# Patient Record
Sex: Female | Born: 1970 | Race: White | Hispanic: No | Marital: Married | Smoking: Never smoker
Health system: Southern US, Community
[De-identification: ages and names within clinical notes are randomized; demographics above are authoritative.]

## PROBLEM LIST (undated history)

## (undated) DIAGNOSIS — J45909 Unspecified asthma, uncomplicated: Secondary | ICD-10-CM

## (undated) DIAGNOSIS — T7840XA Allergy, unspecified, initial encounter: Secondary | ICD-10-CM

## (undated) DIAGNOSIS — E079 Disorder of thyroid, unspecified: Secondary | ICD-10-CM

## (undated) DIAGNOSIS — F419 Anxiety disorder, unspecified: Secondary | ICD-10-CM

## (undated) DIAGNOSIS — K589 Irritable bowel syndrome without diarrhea: Secondary | ICD-10-CM

## (undated) DIAGNOSIS — R011 Cardiac murmur, unspecified: Secondary | ICD-10-CM

## (undated) HISTORY — DX: Anxiety disorder, unspecified: F41.9

## (undated) HISTORY — DX: Disorder of thyroid, unspecified: E07.9

## (undated) HISTORY — DX: Allergy, unspecified, initial encounter: T78.40XA

## (undated) HISTORY — PX: WISDOM TOOTH EXTRACTION: SHX21

## (undated) HISTORY — DX: Cardiac murmur, unspecified: R01.1

## (undated) HISTORY — DX: Irritable bowel syndrome, unspecified: K58.9

## (undated) HISTORY — DX: Unspecified asthma, uncomplicated: J45.909

## (undated) HISTORY — PX: OTHER SURGICAL HISTORY: SHX169

---

## 1998-05-18 ENCOUNTER — Ambulatory Visit (HOSPITAL_COMMUNITY): Admission: RE | Admit: 1998-05-18 | Discharge: 1998-05-18 | Payer: Self-pay

## 1998-06-28 ENCOUNTER — Ambulatory Visit (HOSPITAL_COMMUNITY): Admission: RE | Admit: 1998-06-28 | Discharge: 1998-06-28 | Payer: Self-pay

## 1998-08-09 ENCOUNTER — Ambulatory Visit (HOSPITAL_COMMUNITY): Admission: RE | Admit: 1998-08-09 | Discharge: 1998-08-09 | Payer: Self-pay

## 1998-09-28 ENCOUNTER — Ambulatory Visit (HOSPITAL_COMMUNITY): Admission: RE | Admit: 1998-09-28 | Discharge: 1998-09-28 | Payer: Self-pay

## 2009-08-28 DIAGNOSIS — N281 Cyst of kidney, acquired: Secondary | ICD-10-CM | POA: Insufficient documentation

## 2013-01-03 DIAGNOSIS — K589 Irritable bowel syndrome without diarrhea: Secondary | ICD-10-CM | POA: Insufficient documentation

## 2013-01-03 DIAGNOSIS — J330 Polyp of nasal cavity: Secondary | ICD-10-CM | POA: Insufficient documentation

## 2013-01-03 DIAGNOSIS — R011 Cardiac murmur, unspecified: Secondary | ICD-10-CM | POA: Insufficient documentation

## 2013-01-03 DIAGNOSIS — I059 Rheumatic mitral valve disease, unspecified: Secondary | ICD-10-CM | POA: Insufficient documentation

## 2014-01-23 DIAGNOSIS — Z0000 Encounter for general adult medical examination without abnormal findings: Secondary | ICD-10-CM | POA: Insufficient documentation

## 2015-02-04 DIAGNOSIS — E559 Vitamin D deficiency, unspecified: Secondary | ICD-10-CM | POA: Insufficient documentation

## 2015-06-11 DIAGNOSIS — K219 Gastro-esophageal reflux disease without esophagitis: Secondary | ICD-10-CM | POA: Insufficient documentation

## 2015-06-11 DIAGNOSIS — J309 Allergic rhinitis, unspecified: Secondary | ICD-10-CM | POA: Insufficient documentation

## 2015-06-15 ENCOUNTER — Ambulatory Visit (INDEPENDENT_AMBULATORY_CARE_PROVIDER_SITE_OTHER): Payer: Commercial Managed Care - PPO | Admitting: Pediatrics

## 2015-06-15 ENCOUNTER — Encounter: Payer: Self-pay | Admitting: Pediatrics

## 2015-06-15 VITALS — BP 122/72 | HR 62 | Temp 98.8°F | Resp 16 | Ht 65.2 in | Wt 147.0 lb

## 2015-06-15 DIAGNOSIS — J301 Allergic rhinitis due to pollen: Secondary | ICD-10-CM | POA: Diagnosis not present

## 2015-06-15 DIAGNOSIS — J4530 Mild persistent asthma, uncomplicated: Secondary | ICD-10-CM | POA: Diagnosis not present

## 2015-06-15 DIAGNOSIS — T781XXD Other adverse food reactions, not elsewhere classified, subsequent encounter: Secondary | ICD-10-CM | POA: Diagnosis not present

## 2015-06-15 DIAGNOSIS — T781XXA Other adverse food reactions, not elsewhere classified, initial encounter: Secondary | ICD-10-CM | POA: Insufficient documentation

## 2015-06-15 DIAGNOSIS — E739 Lactose intolerance, unspecified: Secondary | ICD-10-CM | POA: Diagnosis not present

## 2015-06-15 DIAGNOSIS — J4520 Mild intermittent asthma, uncomplicated: Secondary | ICD-10-CM | POA: Insufficient documentation

## 2015-06-15 MED ORDER — ALBUTEROL SULFATE HFA 108 (90 BASE) MCG/ACT IN AERS: INHALATION_SPRAY | RESPIRATORY_TRACT | Status: DC

## 2015-06-15 NOTE — Progress Notes (Signed)
FOLLOW UP NOTE  RE: Kelsey Coleman MRN: [id number] DOB: August 15, 1971 [facility] [facility] [address] [place] [address] Date of Office Visit: 06/15/2015  Assessment Chief Complaint: Follow-up and Breathing Problem   HPI The patient develops some tightness in the chest after mowing the grass. She has been on montelukast  for several months to help a chronic cough She has used an albuterol inhaler in the past  Her abdominal discomfort is much improved. She is definitely lactose intolerant. She has also been avoiding almond,, hazelnut, tree nuts, sesame and pineapple. She is extremely allergic to birch pollen, dust mite, grass pollen and slightly  allergic to ragweed.  Current medications are montelukast  10 mg once a day, loratadine 10 mg once a day if needed and fluticasone 2 sprays per nostril once a day if needed Drug Allergies:  Allergies  Allergen Reactions  . Iodinated Diagnostic Agents     Physical Exam: BP 122/72 mmHg  Pulse 62  Temp(Src) 98.8 F (37.1 C) (Oral)  Resp 16  Ht 5' 5.2" (1.656 m)  Wt 147 lb (66.679 kg)  BMI 24.31 kg/m2  Physical Exam  Constitutional: She is oriented to person, place, and time. She appears well-developed and well-nourished.  HENT:  Eyes normal. Ears normal. Nose normal. Pharynx normal.  Neck: Neck supple.  Cardiovascular:  S1 and S2 normal no murmurs  Pulmonary/Chest:  Clear to percussion and auscultation  Lymphadenopathy:    She has no cervical adenopathy.  Neurological: She is alert and oriented to person, place, and time.    Diagnostics:  Forced vital capacity 3.01 L FEV1 2.60 L. Predicted forced vitalcapacity 3.78 L predicted FEV1 3.05 L-the spirometry is in the normal range  Assessment and Plan: 1. Mild persistent asthma, uncomplicated   2. Allergic rhinitis due to pollen   3. Oral allergy syndrome, subsequent encounter   4. Lactose intolerance in adult    Meds  ordered this encounter  Medications  . albuterol (VENTOLIN HFA) 108 (90 BASE) MCG/ACT inhaler    Sig: TWO PUFFS EVERY 4-6 HOURS IF NEEDED FOR COUGH OR WHEEZE.    Dispense:  8 g    Refill:  1   Patient Instructions  Ventolin 2 puffs every 4 hours if needed for wheezing or coughing spells Loratadine 10 mg once a day if needed for runny nose Fluticasone 2 sprays per nostril once a day if needed for stuffy nose She was given information regarding lactose intolerance  She will avoid almonds, hazelnut, tree nuts, sesame, pineapple.    Return in about 6 months (around 12/14/2015).    Thank you for the opportunity to care for this patient.  Please do not hesitate to contact me with questions.  [facility] [address] [place],  [address] [phone]  J. Charyl Bigger, M.D.

## 2015-06-15 NOTE — Patient Instructions (Signed)
Ventolin 2 puffs every 4 hours if needed for wheezing or coughing spells Loratadine 10 mg once a day if needed for runny nose Fluticasone 2 sprays per nostril once a day if needed for stuffy nose She was given information regarding lactose intolerance  She will avoid almonds, hazelnut, tree nuts, sesame, pineapple.

## 2015-06-15 NOTE — Addendum Note (Signed)
Addended by: Prince Solian A on: 06/15/2015 05:47 PM   Modules accepted: Orders

## 2017-03-27 DIAGNOSIS — R319 Hematuria, unspecified: Secondary | ICD-10-CM | POA: Insufficient documentation

## 2017-05-09 ENCOUNTER — Encounter: Payer: Self-pay | Admitting: Family Medicine

## 2018-02-05 ENCOUNTER — Encounter: Payer: Self-pay | Admitting: Family Medicine

## 2018-10-04 MED FILL — ARMOUR THYROID 30 MG TABLET: 30 | 90 days supply | Qty: 90 | Fill #0 | Status: TO

## 2018-10-04 MED FILL — DICYCLOMINE 10 MG CAPSULE: 10 | 30 days supply | Qty: 150 | Fill #0

## 2018-10-10 DIAGNOSIS — Z833 Family history of diabetes mellitus: Secondary | ICD-10-CM | POA: Diagnosis not present

## 2018-10-10 DIAGNOSIS — Z0000 Encounter for general adult medical examination without abnormal findings: Secondary | ICD-10-CM | POA: Diagnosis not present

## 2018-10-10 DIAGNOSIS — Z7689 Persons encountering health services in other specified circumstances: Secondary | ICD-10-CM | POA: Diagnosis not present

## 2018-10-10 DIAGNOSIS — Z862 Personal history of diseases of the blood and blood-forming organs and certain disorders involving the immune mechanism: Secondary | ICD-10-CM | POA: Diagnosis not present

## 2018-10-10 DIAGNOSIS — Z8349 Family history of other endocrine, nutritional and metabolic diseases: Secondary | ICD-10-CM | POA: Diagnosis not present

## 2018-10-10 DIAGNOSIS — Z23 Encounter for immunization: Secondary | ICD-10-CM | POA: Diagnosis not present

## 2018-10-22 DIAGNOSIS — M25511 Pain in right shoulder: Secondary | ICD-10-CM | POA: Diagnosis not present

## 2018-10-22 DIAGNOSIS — M545 Low back pain: Secondary | ICD-10-CM | POA: Diagnosis not present

## 2018-10-22 DIAGNOSIS — M9901 Segmental and somatic dysfunction of cervical region: Secondary | ICD-10-CM | POA: Diagnosis not present

## 2018-12-13 MED FILL — ARMOUR THYROID 30 MG TABLET: 30 | 90 days supply | Qty: 90 | Fill #0

## 2018-12-26 ENCOUNTER — Ambulatory Visit: Payer: Self-pay | Admitting: Family Medicine

## 2019-01-27 ENCOUNTER — Other Ambulatory Visit: Payer: Self-pay | Admitting: Family Medicine

## 2019-01-27 DIAGNOSIS — Z1231 Encounter for screening mammogram for malignant neoplasm of breast: Secondary | ICD-10-CM

## 2019-01-29 ENCOUNTER — Ambulatory Visit: Payer: Self-pay | Admitting: Family Medicine

## 2019-01-29 ENCOUNTER — Telehealth: Payer: Self-pay

## 2019-01-29 NOTE — Telephone Encounter (Signed)
Questions for Screening COVID-19  Symptom onset: n/a  Travel or Contacts: no travel/ no contact tha tpt aware  During this illness, did/does the patient experience any of the following symptoms? Fever >100.32F []   Yes [x]   No []   Unknown Subjective fever (felt feverish) []   Yes [x]   No []   Unknown Chills []   Yes [x]   No []   Unknown Muscle aches (myalgia) []   Yes [x]   No []   Unknown Runny nose (rhinorrhea) []   Yes [x]   No []   Unknown Sore throat []   Yes [x]   No []   Unknown Cough (new onset or worsening of chronic cough) []   Yes [x]   No []   Unknown Shortness of breath (dyspnea) []   Yes [x]   No []   Unknown Nausea or vomiting []   Yes [x]   No []   Unknown Headache []   Yes [x]   No []   Unknown Abdominal pain  []   Yes [x]   No []   Unknown Diarrhea (?3 loose/looser than normal stools/24hr period) []   Yes [x]   No []   Unknown Other, specify:  Patient risk factors: Smoker? []   Current []   Former []   Never If female, currently pregnant? []   Yes [x]   No  Patient Active Problem List   Diagnosis Date Noted  . Mild persistent asthma 06/15/2015  . Allergic rhinitis due to pollen 06/15/2015  . Oral allergy syndrome 06/15/2015  . Lactose intolerance in adult 06/15/2015  . Allergic rhinitis 06/11/2015  . GERD (gastroesophageal reflux disease) 06/11/2015  . Avitaminosis D 02/04/2015  . Encounter for general adult medical examination without abnormal findings 01/23/2014  . Adaptive colitis 01/03/2013  . Cardiac murmur 01/03/2013  . 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 Nasal cavity polyp 01/03/2013    Plan:  []   High risk for COVID-19 with red flags go to ED (with CP, SOB, weak/lightheaded, or fever > 101.5). Call ahead.  []   High risk for COVID-19 but stable. Inform provider and coordinate time for [facility] visit.   []   No red flags but URI signs or symptoms okay for [facility] visit.

## 2019-01-30 ENCOUNTER — Encounter: Payer: Self-pay | Admitting: Family Medicine

## 2019-01-30 ENCOUNTER — Ambulatory Visit (INDEPENDENT_AMBULATORY_CARE_PROVIDER_SITE_OTHER): Payer: 59 | Admitting: Family Medicine

## 2019-01-30 VITALS — BP 110/80 | HR 67 | Temp 98.3°F | Ht 65.2 in | Wt 158.4 lb

## 2019-01-30 DIAGNOSIS — M9903 Segmental and somatic dysfunction of lumbar region: Secondary | ICD-10-CM | POA: Diagnosis not present

## 2019-01-30 DIAGNOSIS — Z0000 Encounter for general adult medical examination without abnormal findings: Secondary | ICD-10-CM | POA: Diagnosis not present

## 2019-01-30 DIAGNOSIS — M9904 Segmental and somatic dysfunction of sacral region: Secondary | ICD-10-CM | POA: Diagnosis not present

## 2019-01-30 DIAGNOSIS — R3129 Other microscopic hematuria: Secondary | ICD-10-CM

## 2019-01-30 DIAGNOSIS — E039 Hypothyroidism, unspecified: Secondary | ICD-10-CM

## 2019-01-30 DIAGNOSIS — E7219 Other disorders of sulfur-bearing amino-acid metabolism: Secondary | ICD-10-CM | POA: Diagnosis not present

## 2019-01-30 DIAGNOSIS — M545 Low back pain: Secondary | ICD-10-CM | POA: Diagnosis not present

## 2019-01-30 DIAGNOSIS — M791 Myalgia, unspecified site: Secondary | ICD-10-CM

## 2019-01-30 MED ORDER — THYROID 30 MG PO TABS: 30.0000 mg | ORAL_TABLET | Freq: Every day | ORAL | 3 refills | Status: DC

## 2019-01-30 NOTE — Progress Notes (Signed)
Kelsey Coleman is a 48 y.o. female  Chief Complaint  Patient presents with  . Establish Care    est care/ CPE/ fasting/referral for OB    HPI: Kelsey Coleman is a 48 y.o. female here to establish care with our office and for her annual CPE. She is fasting for labs. Pt is a CMA, 3 children, married.   Pt complains of 4 years of diffuse muscle aches, worse in the AM. She states once she is up and moving, muscle pain resolves/improves. She notes that basic activities like light housework, gardening cause extreme muscle soreness the following day. She notes some decrease in grip strength and decreased strength when lifting things, but this is mild.  Pt walks 3 miles a few times per week. Not limited by above symptoms.  No numbness or tingling.  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 She was told she would likely never have any issues or symptoms related to this. Recently she decided to look into the diagnosis and saw muscle weakness listed as a possible symptom. She wonders if there is a connection between this diagnosis and her symptoms. Pt was seen and followed with an integrative medicine in [place] x 2 years. This is who started pt on armour thyroid and Vit D. After 2 years, this practitioner reportedly told pt she was not sure what else to do and thought symptoms may be d/t anxiety. Pt does not agree with this. Pt believes autoimmune labs-work-up was done and she will gets lab results for me to review.   Pt does have h/o anxiety, IBS.  mammo - due in 05/2019 PAP - ? 2015 - due, will f/u with GYN or RTO for this  Past Medical History:  Diagnosis Date  . Allergy   . Anxiety   . Asthma   . Heart murmur   . IBS (irritable bowel syndrome)   . Thyroid disease     Past Surgical History:  Procedure Laterality Date  . right ankle tendon repair      Social History    Socioeconomic History  . Marital status: Married    Spouse name: Not on file  . Number of children: Not on file  . Years of education: Not on file  . Highest education level: Not on file  Occupational History  . Not on file  Social Needs  . Financial resource strain: Not on file  . Food insecurity:    Worry: Not on file    Inability: Not on file  . Transportation needs:    Medical: Not on file    Non-medical: Not on file  Tobacco Use  . Smoking status: Never Smoker  . Smokeless tobacco: Never Used  Substance and Sexual Activity  . Alcohol use: Yes  . Drug use: Never  . Sexual activity: Not on file  Lifestyle  . Physical activity:    Days per week: Not on file    Minutes per session: Not on file  . Stress: Not on file  Relationships  . Social connections:    Talks on phone: Not on file    Gets together: Not on file    Attends religious service: Not on file    Active member of club or organization: Not on file    Attends meetings of clubs or organizations: Not on file    Relationship status: Not on  file  . Intimate partner violence:    Fear of current or ex partner: Not on file    Emotionally abused: Not on file    Physically abused: Not on file    Forced sexual activity: Not on file  Other Topics Concern  . Not on file  Social History Narrative  . Not on file    Family History  Problem Relation Age of Onset  . Cancer Mother   . Hyperlipidemia Mother   . Cancer Father   . Arthritis Father   . Hearing loss Father   . Alcohol abuse Sister   . Mental illness Sister   . Hearing loss Maternal Grandfather   . Hypertension Paternal Grandmother   . Kidney disease Paternal Grandmother      Immunization History  Administered Date(s) Administered  . Tdap 08/12/2013    Outpatient Encounter Medications as of 01/30/2019  Medication Sig Note  . albuterol (VENTOLIN HFA) 108 (90 BASE) MCG/ACT inhaler TWO PUFFS EVERY 4-6 HOURS IF NEEDED FOR COUGH OR WHEEZE.   Marland Kitchen  ALPRAZolam (XANAX) 0.5 MG tablet Take by mouth.   Francia Greaves THYROID 30 MG tablet    . cholecalciferol (VITAMIN D) 25 MCG (1000 UT) tablet Take 1,000 Units by mouth daily. Pt takes 7500u   . dicyclomine (BENTYL) 10 MG capsule Take 10 mg by mouth as needed for spasms.   Marland Kitchen loratadine (CLARITIN) 10 MG tablet Take 10 mg by mouth daily.   . montelukast (SINGULAIR) 10 MG tablet Take 10 mg by mouth at bedtime.   . [DISCONTINUED] Cholecalciferol (VITAMIN D) 2000 UNITS tablet Take 2,000 Units by mouth daily.   . [DISCONTINUED] FIBER SELECT GUMMIES PO Take 2 g by mouth 2 (two) times daily as needed.   . [DISCONTINUED] Magnesium 200 MG TABS Take 1 tablet by mouth daily.   . [DISCONTINUED] Multiple Vitamins-Minerals (MULTIVITAMIN & MINERAL PO) Take by mouth. 06/15/2015: Received from: [facility]  . [DISCONTINUED] omeprazole (PRILOSEC) 40 MG capsule Take 40 mg by mouth 2 (two) times daily.    No facility-administered encounter medications on file as of 01/30/2019.      ROS: Gen: no fever, chills  Skin: no rash, itching ENT: no ear pain, ear drainage, nasal congestion, rhinorrhea, sinus pressure, sore throat Eyes: no blurry vision, double vision Resp: no cough, wheeze,SOB CV: no CP, palpitations, LE edema,  GI: no heartburn, n/v/d/c, abd pain GU: no dysuria, urgency, frequency; + h/o microscopic hematuria; no vaginal itching, odor, discharge MSK: as above in HPI Neuro: no dizziness, headache Psych: no depression, anxiety, insomnia   Allergies  Allergen Reactions  . Casein Diarrhea  . Chamomile Diarrhea  . Honey Diarrhea  . Honey Bee Venom Itching and Swelling  . Iodinated Diagnostic Agents   . Milk-Related Compounds Other (See Comments)  . Pollen Extract Cough, Diarrhea and Itching  . Fruit Extracts Diarrhea and Itching    BP 110/80   Pulse 67   Temp 98.3 F (36.8 C) (Oral)   Ht 5' 5.2" (1.656 m)   Wt 158 lb 6.4 oz (71.8 kg)   SpO2 99%   BMI 26.20 kg/m   Physical Exam   Constitutional: She is oriented to person, place, and time. She appears well-developed and well-nourished. No distress.  HENT:  Head: Normocephalic and atraumatic.  Right Ear: Tympanic membrane and ear canal normal.  Left Ear: Tympanic membrane and ear canal normal.  Nose: Nose normal.  Mouth/Throat: Oropharynx is clear and moist and mucous membranes are normal.  Eyes: Pupils are equal, round, and reactive to light. Conjunctivae and EOM are normal.  Neck: Normal range of motion. No thyromegaly present.  Cardiovascular: Normal rate, regular rhythm, normal heart sounds and intact distal pulses.  No murmur heard. Pulmonary/Chest: Effort normal and breath sounds normal. No respiratory distress.  Abdominal: Soft. Bowel sounds are normal. She exhibits no distension and no mass. There is no abdominal tenderness.  Musculoskeletal: Normal range of motion.        General: No tenderness or edema.  Lymphadenopathy:    She has no cervical adenopathy.  Neurological: She is alert and oriented to person, place, and time. She has normal strength. No sensory deficit. She exhibits normal muscle tone. Coordination normal.  Skin: Skin is warm and dry.  Psychiatric: She has a normal mood and affect. Her behavior is normal.     A/P:  1. Annual physical exam - mammo UTD and due in 05/2019 - pt is due for PAP - will likely see GYN for this and if not, pt will schedule appt with me at a future date - immunizations UTD - cont with regular walking and healthy diet - UTD on dental exam - ALT - AST - Basic Metabolic Panel (BMET) - Lipid panel - next CPE in 1 year  2. Microscopic hematuria - Basic Metabolic Panel (BMET) - Urinalysis  3. Hypothyroidism, unspecified type Refill: - thyroid (ARMOUR) 30 MG tablet; Take 1 tablet (30 mg total) by mouth daily before breakfast.  Dispense: 90 tablet; Refill: 3 - T3, free - T4, free; Future - TSH  4. 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 Muscle pain - x 4 years, worse in AM  - ? Fibromyalgia vs other - amb referral to rheumatology - recommend [facility]

## 2019-01-31 ENCOUNTER — Encounter: Payer: Self-pay | Admitting: Family Medicine

## 2019-01-31 LAB — BASIC METABOLIC PANEL
BUN: 15 mg/dL (ref 7–25)
CO2: 25 mmol/L (ref 20–32)
Calcium: 9.2 mg/dL (ref 8.6–10.2)
Chloride: 104 mmol/L (ref 98–110)
Creat: 0.84 mg/dL (ref 0.50–1.10)
Glucose, Bld: 87 mg/dL (ref 65–99)
Potassium: 4.4 mmol/L (ref 3.5–5.3)
Sodium: 138 mmol/L (ref 135–146)

## 2019-01-31 LAB — URINALYSIS
Bilirubin Urine: NEGATIVE
Glucose, UA: NEGATIVE
Ketones, ur: NEGATIVE
Nitrite: NEGATIVE
Protein, ur: NEGATIVE
Specific Gravity, Urine: 1.012 (ref 1.001–1.03)
pH: 5.5 (ref 5.0–8.0)

## 2019-01-31 LAB — LIPID PANEL
Cholesterol: 253 mg/dL — ABNORMAL HIGH (ref <200)
HDL: 58 mg/dL (ref >=50)
LDL Cholesterol (Calc): 174 mg/dL (calc) — ABNORMAL HIGH
Non-HDL Cholesterol (Calc): 195 mg/dL (calc) — ABNORMAL HIGH (ref <130)
Total CHOL/HDL Ratio: 4.4 (calc) (ref <5.0)
Triglycerides: 96 mg/dL (ref <150)

## 2019-01-31 LAB — TSH: TSH: 0.99 mIU/L

## 2019-01-31 LAB — ALT: ALT: 23 U/L (ref 6–29)

## 2019-01-31 LAB — T3, FREE: T3, Free: 3.1 pg/mL (ref 2.3–4.2)

## 2019-01-31 LAB — AST: AST: 29 U/L (ref 10–35)

## 2019-02-06 ENCOUNTER — Encounter: Payer: Self-pay | Admitting: Family Medicine

## 2019-02-06 ENCOUNTER — Telehealth: Payer: Self-pay

## 2019-02-06 DIAGNOSIS — M791 Myalgia, unspecified site: Secondary | ICD-10-CM

## 2019-02-06 DIAGNOSIS — R3129 Other microscopic hematuria: Secondary | ICD-10-CM

## 2019-02-06 NOTE — Telephone Encounter (Signed)
Questions for Screening COVID-19  Symptom onset:n/a  Travel or Contacts: no  During this illness, did/does the patient experience any of the following symptoms? Fever >100.61F []   Yes [x]   No []   Unknown Subjective fever (felt feverish) []   Yes [x]   No []   Unknown Chills []   Yes [x]   No []   Unknown Muscle aches (myalgia) []   Yes [x]   No []   Unknown Runny nose (rhinorrhea) []   Yes [x]   No []   Unknown Sore throat []   Yes [x]   No []   Unknown Cough (new onset or worsening of chronic cough) []   Yes [x]   No []   Unknown Shortness of breath (dyspnea) []   Yes [x]   No []   Unknown Nausea or vomiting []   Yes [x]   No []   Unknown Headache []   Yes [x]   No []   Unknown Abdominal pain  []   Yes [x]   No []   Unknown Diarrhea (?3 loose/looser than normal stools/24hr period) []   Yes [x]   No []   Unknown Other, specify:  Patient risk factors: Smoker? []   Current []   Former []   Never If female, currently pregnant? []   Yes []   No  Patient Active Problem List   Diagnosis Date Noted  . 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 Mild persistent asthma 06/15/2015  . Allergic rhinitis due to pollen 06/15/2015  . Oral allergy syndrome 06/15/2015  . Lactose intolerance in adult 06/15/2015  . Allergic rhinitis 06/11/2015  . GERD (gastroesophageal reflux disease) 06/11/2015  . Avitaminosis D 02/04/2015  . Encounter for general adult medical examination without abnormal findings 01/23/2014  . Adaptive colitis 01/03/2013  . Cardiac murmur 01/03/2013  . 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 Nasal cavity polyp 01/03/2013    Plan:  []   High risk for COVID-19 with red flags go to ED (with CP, SOB, weak/lightheaded, or fever > 101.5). Call ahead.  []   High risk for COVID-19 but stable. Inform provider and coordinate time for [facility] visit.   []   No red flags but URI signs or symptoms okay for [facility] visit.

## 2019-02-07 ENCOUNTER — Other Ambulatory Visit (INDEPENDENT_AMBULATORY_CARE_PROVIDER_SITE_OTHER): Payer: 59

## 2019-02-07 DIAGNOSIS — E039 Hypothyroidism, unspecified: Secondary | ICD-10-CM

## 2019-02-07 DIAGNOSIS — R3129 Other microscopic hematuria: Secondary | ICD-10-CM | POA: Diagnosis not present

## 2019-02-07 DIAGNOSIS — Z0000 Encounter for general adult medical examination without abnormal findings: Secondary | ICD-10-CM | POA: Diagnosis not present

## 2019-02-07 DIAGNOSIS — M791 Myalgia, unspecified site: Secondary | ICD-10-CM

## 2019-02-07 LAB — URINALYSIS, ROUTINE W REFLEX MICROSCOPIC
Bilirubin Urine: NEGATIVE
Ketones, ur: NEGATIVE
Leukocytes,Ua: NEGATIVE
Nitrite: NEGATIVE
Specific Gravity, Urine: >=1.03 — AB (ref 1.000–1.030)
Total Protein, Urine: NEGATIVE
Urine Glucose: NEGATIVE
Urobilinogen, UA: 0.2 (ref 0.0–1.0)
pH: 5.5 (ref 5.0–8.0)

## 2019-02-07 LAB — C-REACTIVE PROTEIN: CRP: <1 mg/dL (ref 0.5–20.0)

## 2019-02-07 LAB — SEDIMENTATION RATE: Sed Rate: 6 mm/hr (ref 0–20)

## 2019-02-11 LAB — ANTI-NUCLEAR AB-TITER (ANA TITER): ANA Titer 1: 1:40 {titer} — ABNORMAL HIGH

## 2019-02-11 LAB — RHEUMATOID FACTOR: Rheumatoid fact SerPl-aCnc: <14 IU/mL (ref <14)

## 2019-02-11 LAB — VITAMIN D 25 HYDROXY (VIT D DEFICIENCY, FRACTURES): Vit D, 25-Hydroxy: 79 ng/mL (ref 30–100)

## 2019-02-11 LAB — ANTI-DNA ANTIBODY, DOUBLE-STRANDED: ds DNA Ab: 1 IU/mL

## 2019-02-11 LAB — T4, FREE: Free T4: 0.9 ng/dL (ref 0.8–1.8)

## 2019-02-11 LAB — ANA: Anti Nuclear Antibody (ANA): POSITIVE — AB

## 2019-02-25 ENCOUNTER — Encounter: Payer: Self-pay | Admitting: Family Medicine

## 2019-02-25 DIAGNOSIS — R768 Other specified abnormal immunological findings in serum: Secondary | ICD-10-CM | POA: Diagnosis not present

## 2019-02-25 DIAGNOSIS — M549 Dorsalgia, unspecified: Secondary | ICD-10-CM | POA: Diagnosis not present

## 2019-02-25 DIAGNOSIS — M545 Low back pain: Secondary | ICD-10-CM | POA: Diagnosis not present

## 2019-02-25 DIAGNOSIS — M064 Inflammatory polyarthropathy: Secondary | ICD-10-CM | POA: Diagnosis not present

## 2019-03-04 DIAGNOSIS — Z6826 Body mass index (BMI) 26.0-26.9, adult: Secondary | ICD-10-CM | POA: Diagnosis not present

## 2019-03-04 DIAGNOSIS — E079 Disorder of thyroid, unspecified: Secondary | ICD-10-CM | POA: Insufficient documentation

## 2019-03-04 DIAGNOSIS — F419 Anxiety disorder, unspecified: Secondary | ICD-10-CM | POA: Insufficient documentation

## 2019-03-04 DIAGNOSIS — Z1231 Encounter for screening mammogram for malignant neoplasm of breast: Secondary | ICD-10-CM | POA: Diagnosis not present

## 2019-03-04 DIAGNOSIS — E559 Vitamin D deficiency, unspecified: Secondary | ICD-10-CM | POA: Diagnosis not present

## 2019-03-04 DIAGNOSIS — Z01419 Encounter for gynecological examination (general) (routine) without abnormal findings: Secondary | ICD-10-CM | POA: Diagnosis not present

## 2019-03-04 DIAGNOSIS — E039 Hypothyroidism, unspecified: Secondary | ICD-10-CM | POA: Insufficient documentation

## 2019-03-05 DIAGNOSIS — Z01419 Encounter for gynecological examination (general) (routine) without abnormal findings: Secondary | ICD-10-CM | POA: Diagnosis not present

## 2019-03-07 DIAGNOSIS — Z9189 Other specified personal risk factors, not elsewhere classified: Secondary | ICD-10-CM | POA: Insufficient documentation

## 2019-03-10 DIAGNOSIS — R768 Other specified abnormal immunological findings in serum: Secondary | ICD-10-CM | POA: Diagnosis not present

## 2019-03-10 DIAGNOSIS — M549 Dorsalgia, unspecified: Secondary | ICD-10-CM | POA: Diagnosis not present

## 2019-03-10 DIAGNOSIS — M359 Systemic involvement of connective tissue, unspecified: Secondary | ICD-10-CM | POA: Diagnosis not present

## 2019-03-18 DIAGNOSIS — N859 Noninflammatory disorder of uterus, unspecified: Secondary | ICD-10-CM | POA: Diagnosis not present

## 2019-03-18 DIAGNOSIS — D259 Leiomyoma of uterus, unspecified: Secondary | ICD-10-CM | POA: Insufficient documentation

## 2019-03-18 DIAGNOSIS — N852 Hypertrophy of uterus: Secondary | ICD-10-CM | POA: Diagnosis not present

## 2019-04-01 ENCOUNTER — Encounter: Payer: Self-pay | Admitting: Family Medicine

## 2019-04-01 DIAGNOSIS — E039 Hypothyroidism, unspecified: Secondary | ICD-10-CM

## 2019-04-01 MED ORDER — LEVOTHYROXINE SODIUM 75 MCG PO TABS: 75.0000 ug | ORAL_TABLET | Freq: Every day | ORAL | 3 refills | Status: DC

## 2019-04-01 MED FILL — LEVOTHYROXINE 75 MCG TABLET: 75 | 90 days supply | Qty: 90 | Fill #0

## 2019-04-15 DIAGNOSIS — R9389 Abnormal findings on diagnostic imaging of other specified body structures: Secondary | ICD-10-CM | POA: Diagnosis not present

## 2019-04-15 DIAGNOSIS — N840 Polyp of corpus uteri: Secondary | ICD-10-CM | POA: Diagnosis not present

## 2019-04-29 DIAGNOSIS — N840 Polyp of corpus uteri: Secondary | ICD-10-CM | POA: Diagnosis not present

## 2019-04-29 DIAGNOSIS — D259 Leiomyoma of uterus, unspecified: Secondary | ICD-10-CM | POA: Diagnosis not present

## 2019-05-12 MED FILL — oxyCODONE HCL 5 MG TABS: 5 | 2 days supply | Qty: 10 | Fill #0

## 2019-05-12 MED FILL — IBUPROFEN 800 MG TABS: 800 | 6 days supply | Qty: 20 | Fill #0

## 2019-05-12 MED FILL — miSOPROStol 200 MCG TABS: 200 | 1 days supply | Qty: 1 | Fill #0

## 2019-05-14 DIAGNOSIS — D259 Leiomyoma of uterus, unspecified: Secondary | ICD-10-CM | POA: Diagnosis not present

## 2019-05-14 DIAGNOSIS — N920 Excessive and frequent menstruation with regular cycle: Secondary | ICD-10-CM | POA: Diagnosis not present

## 2019-05-14 DIAGNOSIS — N9985 Post endometrial ablation syndrome: Secondary | ICD-10-CM | POA: Diagnosis not present

## 2019-05-14 DIAGNOSIS — N840 Polyp of corpus uteri: Secondary | ICD-10-CM | POA: Diagnosis not present

## 2019-05-14 DIAGNOSIS — Z3202 Encounter for pregnancy test, result negative: Secondary | ICD-10-CM | POA: Diagnosis not present

## 2019-05-14 DIAGNOSIS — N939 Abnormal uterine and vaginal bleeding, unspecified: Secondary | ICD-10-CM | POA: Diagnosis not present

## 2019-06-12 ENCOUNTER — Ambulatory Visit: Payer: 59

## 2019-06-30 MED FILL — LEVOTHYROXINE 75 MCG TABLET: 75 | 90 days supply | Qty: 90 | Fill #1

## 2019-07-14 ENCOUNTER — Encounter: Payer: Self-pay | Admitting: Family Medicine

## 2019-07-15 MED ORDER — ALPRAZOLAM 0.5 MG PO TABS: 0.5000 mg | ORAL_TABLET | Freq: Every evening | ORAL | 0 refills | Status: DC | PRN

## 2019-07-15 MED FILL — ALPRAZolam 0.5 MG TABS: 0.5 | 30 days supply | Qty: 30 | Fill #0

## 2019-07-17 ENCOUNTER — Encounter: Payer: Self-pay | Admitting: Family Medicine

## 2019-09-25 MED FILL — LEVOTHYROXINE SODIUM 75 MCG: 75 | 90 days supply | Qty: 90 | Fill #2

## 2019-10-30 DIAGNOSIS — R311 Benign essential microscopic hematuria: Secondary | ICD-10-CM | POA: Insufficient documentation

## 2019-10-30 DIAGNOSIS — R102 Pelvic and perineal pain: Secondary | ICD-10-CM | POA: Diagnosis not present

## 2019-10-30 DIAGNOSIS — N8500 Endometrial hyperplasia, unspecified: Secondary | ICD-10-CM | POA: Diagnosis not present

## 2019-10-30 MED FILL — DOXYCYCLINE HYCLATE 100 MG: 100 | 7 days supply | Qty: 14 | Fill #0

## 2019-12-08 MED FILL — DICYCLOMINE 10 MG CAPSULE: 10 | 30 days supply | Qty: 150 | Fill #0

## 2019-12-26 MED FILL — LEVOTHYROXINE SODIUM 75 MCG: 75 | 90 days supply | Qty: 90 | Fill #3

## 2020-02-06 ENCOUNTER — Encounter: Payer: 59 | Admitting: Family Medicine

## 2020-02-12 ENCOUNTER — Other Ambulatory Visit: Payer: Self-pay

## 2020-02-13 ENCOUNTER — Ambulatory Visit (INDEPENDENT_AMBULATORY_CARE_PROVIDER_SITE_OTHER): Payer: 59 | Admitting: Family Medicine

## 2020-02-13 ENCOUNTER — Encounter: Payer: Self-pay | Admitting: Family Medicine

## 2020-02-13 VITALS — BP 100/80 | HR 60 | Temp 98.0°F | Ht 65.5 in | Wt 160.6 lb

## 2020-02-13 DIAGNOSIS — G8929 Other chronic pain: Secondary | ICD-10-CM

## 2020-02-13 DIAGNOSIS — J4530 Mild persistent asthma, uncomplicated: Secondary | ICD-10-CM | POA: Diagnosis not present

## 2020-02-13 DIAGNOSIS — Z0000 Encounter for general adult medical examination without abnormal findings: Secondary | ICD-10-CM | POA: Diagnosis not present

## 2020-02-13 DIAGNOSIS — Z1211 Encounter for screening for malignant neoplasm of colon: Secondary | ICD-10-CM | POA: Diagnosis not present

## 2020-02-13 DIAGNOSIS — E559 Vitamin D deficiency, unspecified: Secondary | ICD-10-CM

## 2020-02-13 DIAGNOSIS — M545 Low back pain, unspecified: Secondary | ICD-10-CM

## 2020-02-13 DIAGNOSIS — E7219 Other disorders of sulfur-bearing amino-acid metabolism: Secondary | ICD-10-CM | POA: Diagnosis not present

## 2020-02-13 DIAGNOSIS — E039 Hypothyroidism, unspecified: Secondary | ICD-10-CM

## 2020-02-13 LAB — LIPID PANEL
Cholesterol: 244 mg/dL — ABNORMAL HIGH (ref 0–200)
HDL: 48.2 mg/dL (ref >39.00)
LDL Cholesterol: 166 mg/dL — ABNORMAL HIGH (ref 0–99)
NonHDL: 195.66
Total CHOL/HDL Ratio: 5
Triglycerides: 146 mg/dL (ref 0.0–149.0)
VLDL: 29.2 mg/dL (ref 0.0–40.0)

## 2020-02-13 LAB — BASIC METABOLIC PANEL
BUN: 14 mg/dL (ref 6–23)
CO2: 29 mEq/L (ref 19–32)
Calcium: 9.4 mg/dL (ref 8.4–10.5)
Chloride: 104 mEq/L (ref 96–112)
Creatinine, Ser: 0.95 mg/dL (ref 0.40–1.20)
GFR: 62.78 mL/min (ref >60.00)
Glucose, Bld: 90 mg/dL (ref 70–99)
Potassium: 4.4 mEq/L (ref 3.5–5.1)
Sodium: 138 mEq/L (ref 135–145)

## 2020-02-13 LAB — CBC
HCT: 39.8 % (ref 36.0–46.0)
Hemoglobin: 13.6 g/dL (ref 12.0–15.0)
MCHC: 34.1 g/dL (ref 30.0–36.0)
MCV: 93.4 fl (ref 78.0–100.0)
Platelets: 226 10*3/uL (ref 150.0–400.0)
RBC: 4.26 Mil/uL (ref 3.87–5.11)
RDW: 12.7 % (ref 11.5–15.5)
WBC: 6.3 10*3/uL (ref 4.0–10.5)

## 2020-02-13 LAB — AST: AST: 22 U/L (ref 0–37)

## 2020-02-13 LAB — VITAMIN D 25 HYDROXY (VIT D DEFICIENCY, FRACTURES): VITD: 70.23 ng/mL (ref 30.00–100.00)

## 2020-02-13 LAB — T4, FREE: Free T4: 1.16 ng/dL (ref 0.60–1.60)

## 2020-02-13 LAB — TSH: TSH: 0.23 u[IU]/mL — ABNORMAL LOW (ref 0.35–4.50)

## 2020-02-13 LAB — ALT: ALT: 20 U/L (ref 0–35)

## 2020-02-13 NOTE — Patient Instructions (Signed)
[email or web address] 

## 2020-02-13 NOTE — Progress Notes (Signed)
Kelsey Coleman is a 49 y.o. female  Chief Complaint  Patient presents with  . Annual Exam    Pt here for physical.  Pt is fasting for lab work.  Pt is UTD on pap and Mammogram.  Pt c/o still having back pain.    HPI: Kelsey Coleman is a 49 y.o. female here for annual CPE, fasting labs. She follows with GYN Dr. Lucillie Garfinkel at [facility].  Last PAP: UTD Last mammo: UTD Last colonoscopy: never had a colonoscopy  Diet/Exercise: limits sweets, overall healthy; walking 2-3x/wk, 4 miles per walk Dental: UTD Eye: appt scheduled in 02/2020  Med refills needed today? None  She has chronic low back pain and diffuse muscle pain x years. Worse in the past year. Saw rheum with neg work-up in 02/2019.   From my 01/30/2019 encounter with patient: Pt complains of 4 years of diffuse muscle aches, worse in the AM. She states once she is up and moving, muscle pain resolves/improves. She notes that basic activities like light housework, gardening cause extreme muscle soreness the following day. She notes some decrease in grip strength and decreased strength when lifting things, but this is mild.  Pt walks 3 miles a few times per week. Not limited by above symptoms.  No numbness or tingling.  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 She was told she would likely never have any issues or symptoms related to this. Recently she decided to look into the diagnosis and saw muscle weakness listed as a possible symptom. She wonders if there is a connection between this diagnosis and her symptoms. Pt was seen and followed with an integrative medicine in [place] x 2 years. This is who started pt on armour thyroid and Vit D. After 2 years, this practitioner reportedly told pt she was not sure what else to do and thought symptoms may be d/t anxiety. Pt does not agree with this.   Past Medical History:   Diagnosis Date  . Allergy   . Anxiety   . Asthma   . Heart murmur   . IBS (irritable bowel syndrome)   . Thyroid disease     Past Surgical History:  Procedure Laterality Date  . ABLATION  05/16/2019  . right ankle tendon repair      Social History   Socioeconomic History  . Marital status: Married    Spouse name: Not on file  . Number of children: Not on file  . Years of education: Not on file  . Highest education level: Not on file  Occupational History  . Not on file  Tobacco Use  . Smoking status: Never Smoker  . Smokeless tobacco: Never Used  Substance and Sexual Activity  . Alcohol use: Yes  . Drug use: Never  . Sexual activity: Not on file  Other Topics Concern  . Not on file  Social History Narrative  . Not on file   Social Determinants of Health   Financial Resource Strain:   . Difficulty of Paying Living Expenses:   Food Insecurity:   . Worried About Charity fundraiser in the Last Year:   . Arboriculturist in the Last Year:   Transportation Needs:   . Film/video editor (Medical):   Marland Kitchen Lack of Transportation (Non-Medical):   Physical Activity:   . Days of Exercise per Week:   .  Minutes of Exercise per Session:   Stress:   . Feeling of Stress :   Social Connections:   . Frequency of Communication with Friends and Family:   . Frequency of Social Gatherings with Friends and Family:   . Attends Religious Services:   . Active Member of Clubs or Organizations:   . Attends Archivist Meetings:   Marland Kitchen Marital Status:   Intimate Partner Violence:   . Fear of Current or Ex-Partner:   . Emotionally Abused:   Marland Kitchen Physically Abused:   . Sexually Abused:     Family History  Problem Relation Age of Onset  . Cancer Mother   . Hyperlipidemia Mother   . Cancer Father   . Arthritis Father   . Hearing loss Father   . Alcohol abuse Sister   . Mental illness Sister   . Hearing loss Maternal Grandfather   . Hypertension Paternal Grandmother     . Kidney disease Paternal Grandmother      Immunization History  Administered Date(s) Administered  . Influenza-Unspecified 05/23/2019  . PFIZER SARS-COV-2 Vaccination 10/17/2019, 11/07/2019  . Pneumococcal Polysaccharide-23 02/09/2017  . Tdap 08/12/2013    Outpatient Encounter Medications as of 02/13/2020  Medication Sig  . albuterol (VENTOLIN HFA) 108 (90 BASE) MCG/ACT inhaler TWO PUFFS EVERY 4-6 HOURS IF NEEDED FOR COUGH OR WHEEZE.  Marland Kitchen ALPRAZolam (XANAX) 0.5 MG tablet Take 1 tablet (0.5 mg total) by mouth at bedtime as needed for anxiety.  . Calcium 500-100 MG-UNIT CHEW Calcium 500  . cholecalciferol (VITAMIN D) 25 MCG (1000 UT) tablet Take 1,000 Units by mouth daily. Pt takes 7500u  . dicyclomine (BENTYL) 10 MG capsule Take 10 mg by mouth as needed for spasms.  Marland Kitchen levothyroxine (SYNTHROID) 75 MCG tablet Take 1 tablet (75 mcg total) by mouth daily.  Marland Kitchen loratadine (CLARITIN) 10 MG tablet Take 10 mg by mouth daily.  . [DISCONTINUED] montelukast (SINGULAIR) 10 MG tablet Take 10 mg by mouth at bedtime.  . [DISCONTINUED] thyroid (ARMOUR) 30 MG tablet Take 1 tablet (30 mg total) by mouth daily before breakfast.   No facility-administered encounter medications on file as of 02/13/2020.     ROS: Gen: no fever, chills  Skin: no rash, itching ENT: no ear pain, ear drainage, nasal congestion, rhinorrhea, sinus pressure, sore throat Eyes: no blurry vision, double vision Resp: no cough, wheeze,SOB Breast: no breast tenderness, no nipple discharge, no breast masses CV: no CP, palpitations, LE edema,  GI: no heartburn, n/v/d/c, abd pain GU: no dysuria, urgency, frequency, hematuria MSK: no joint pain, + myalgias, + back pain Neuro: no dizziness, headache, weakness, vertigo Psych: no depression, anxiety, insomnia   Allergies  Allergen Reactions  . Casein Diarrhea  . Chamomile Diarrhea  . Honey Diarrhea  . Honey Bee Venom Itching and Swelling  . Iodinated Diagnostic Agents   .  Milk-Related Compounds Other (See Comments)  . Pollen Extract Cough, Diarrhea and Itching  . Fruit Extracts Diarrhea and Itching    BP 100/80 (BP Location: Left Arm, Patient Position: Sitting, Cuff Size: Normal)   Pulse 60   Temp 98 F (36.7 C) (Temporal)   Ht 5' 5.5" (1.664 m)   Wt 160 lb 9.6 oz (72.8 kg)   SpO2 98%   BMI 26.32 kg/m    BP Readings from Last 3 Encounters:  02/13/20 100/80  01/30/19 110/80  06/15/15 122/72   Pulse Readings from Last 3 Encounters:  02/13/20 60  01/30/19 67  06/15/15 62   Wt  Readings from Last 3 Encounters:  02/13/20 160 lb 9.6 oz (72.8 kg)  01/30/19 158 lb 6.4 oz (71.8 kg)  06/15/15 147 lb (66.7 kg)   Physical Exam Constitutional:      General: She is not in acute distress.    Appearance: She is well-developed.  HENT:     Head: Normocephalic and atraumatic.     Right Ear: Tympanic membrane and ear canal normal.     Left Ear: Tympanic membrane and ear canal normal.     Nose: Nose normal.  Eyes:     Conjunctiva/sclera: Conjunctivae normal.     Pupils: Pupils are equal, round, and reactive to light.  Neck:     Thyroid: No thyromegaly.  Cardiovascular:     Rate and Rhythm: Normal rate and regular rhythm.     Heart sounds: Normal heart sounds. No murmur heard.   Pulmonary:     Effort: Pulmonary effort is normal. No respiratory distress.     Breath sounds: Normal breath sounds. No wheezing or rhonchi.  Abdominal:     General: Bowel sounds are normal. There is no distension.     Palpations: Abdomen is soft. There is no mass.     Tenderness: There is no abdominal tenderness.  Musculoskeletal:     Cervical back: Neck supple.  Lymphadenopathy:     Cervical: No cervical adenopathy.  Skin:    General: Skin is warm and dry.  Neurological:     Mental Status: She is alert and oriented to person, place, and time.     Motor: No abnormal muscle tone.     Coordination: Coordination normal.  Psychiatric:        Behavior: Behavior normal.       A/P:   1. Annual physical exam - UTD on PAP and mammo - follows with GYN Dr. Royston Sinner - due for colonoscopy, referral placed today - discussed importance of regular CV exercise, healthy diet, adequate sleep - UTD on dental and eye appt scheduled - ALT - AST - Basic metabolic panel - CBC - Lipid panel - next CPE in 1 year  2. Avitaminosis D - on Vit D 1000IU daily - VITAMIN D 25 Hydroxy (Vit-D Deficiency, Fractures)  3. Mild persistent asthma without complication - stable, well-controlled, no recent exacerbations  4. Hypothyroidism, unspecified type - on levothyroxine 23mcg daily - TSH - T4, free  5. Screening for colon cancer - Ambulatory referral to Gastroenterology  6. Chronic bilateral low back pain without sciatica 7. Hypermethioninemia (HCC) - symptoms x 4 years - rheum eval negative/normal in 02/2019 after following with integrative medicine x 2 years - Ambulatory referral to Physical Medicine Rehab   This visit occurred during the SARS-CoV-2 public health emergency.  Safety protocols were in place, including screening questions prior to the visit, additional usage of staff PPE, and extensive cleaning of exam room while observing appropriate contact time as indicated for disinfecting solutions.

## 2020-02-18 ENCOUNTER — Encounter: Payer: Self-pay | Admitting: Physical Medicine and Rehabilitation

## 2020-03-30 ENCOUNTER — Other Ambulatory Visit: Payer: Self-pay | Admitting: Family Medicine

## 2020-03-30 DIAGNOSIS — E039 Hypothyroidism, unspecified: Secondary | ICD-10-CM

## 2020-03-30 MED FILL — LEVOTHYROXINE SODIUM 75 MCG: 75 | 90 days supply | Qty: 90 | Fill #0

## 2020-03-30 NOTE — Telephone Encounter (Signed)
Last OV 02/13/20 Last fill 08/04/*20 #90/3

## 2020-04-07 ENCOUNTER — Encounter: Payer: 59 | Attending: Physical Medicine and Rehabilitation | Admitting: Physical Medicine and Rehabilitation

## 2020-04-07 ENCOUNTER — Other Ambulatory Visit: Payer: Self-pay

## 2020-04-07 ENCOUNTER — Encounter: Payer: Self-pay | Admitting: Physical Medicine and Rehabilitation

## 2020-04-07 VITALS — BP 106/74 | HR 61 | Temp 98.1°F | Ht 65.5 in | Wt 162.6 lb

## 2020-04-07 DIAGNOSIS — M7918 Myalgia, other site: Secondary | ICD-10-CM

## 2020-04-07 MED ORDER — METAXALONE 800 MG PO TABS
800.0000 mg | ORAL_TABLET | Freq: Two times a day (BID) | ORAL | 5 refills | Status: DC | PRN
Start: 2020-04-07 — End: 2021-02-23

## 2020-04-07 MED FILL — METAXALONE 800 MG TABLET: 800 | 30 days supply | Qty: 60 | Fill #0

## 2020-04-07 NOTE — Patient Instructions (Signed)
1. Myofascial pain Syndrme  - Skelaxin- 800 mg 2x/day as needed-   - pt has shown that she failed Flexeril because gets SO sedated, cannot stay awake.   I think other muscle relaxants are going to be in the same category- Skelaxin is the only muscle relaxant without anticholinergic  Effects, so it doesn't cause sedation- has to stay awake for work.   2. Suggest a theracane- hold pressure- 2-4 minutes- with theracane or tennis ball on areas that have a muscle spasm- no more than 30-45 minutes.  youtube videos.   3. PT referral for myofascial release- eval and treat.    4. Showed pt pec pulls 4+ minutes and under armpit pull up pulls 4+ minutes as well as a hand trigger points- 2+ minutes.    5. F/U in 1 month- wait on Duloxetine/lyrica.  Can do trigger point injections at f/u.

## 2020-04-07 NOTE — Progress Notes (Signed)
Subjective:    Patient ID: Kelsey Coleman, female    DOB: Jan 04, 1971, 49 y.o.   MRN: [id number]  HPI  Pt is a 49 yr old female with hx of fibroids s/p ablation, Hyper methianemia  and IBS, hypothyroidism, and Anxiety with remote rx for Xanax (11/20) Here for evaluation of low back pain without sciatica.   Having low back for a long time.   1 specific spot- on R low back- been there for years.   Usually the same. Across the whole back appears/seems to be getting worse for past few years- makes it harder to do 'normal" things- like cannot work out in the yard (can now do 1 hr at a time; has to space out what she does).   Morning- all over hurting- been for years and years.  Was just in AM- now when sitting for long periods of time, bothers her.   More in feet and legs when sitting too long.  Muscles hurt- if does "weird" activity-  Cranked window on old car- use a muscle for unusual activity, and hurts really bad.  Also worse with doing dishes, repetitive motions, doing laundry, vacuuming.     Doesn't think its "stress"-  Doesn't go through periods of worse/better.  Doesn't have "exacerbations"-  But progression has made her feel worse.     Has gone to [place]- no Autoimmune- -disorders per Rheumatology.   Last TSH 0.23 to 0.99 Free T4- 1.16   Spends 2 days recovering if overdoes it.   Only responded to 1 spot on legs with fibromyalgia "testing". - no other spot.  [place] [address] [facility]- a female physician.    Tried: Has tried a lot of supplements/herbs, etc from integrative medicine Likes naproxen- takes the edge off.- don't take often. Doesn't take regularly- ~1x/week on average- and can take for a few days if "done something".  Takes flexeril for muscle relaxant- prn-  Puts her to sleep cannot take during day.   Has R kidney cyst- doing OK.  On thyroid and Vit D- hasn't noticed a difference with pain.  Has seen Chiropractor- not  long term- didn't really help.      Pain Inventory Average Pain 2 Pain Right Now 2 My pain is burning, dull and aching  In the last 24 hours, has pain interfered with the following? General activity 3 Relation with others 0 Enjoyment of life 2 What TIME of day is your pain at its worst? morning Sleep (in general) Good  Pain is worse with: bending, inactivity, standing and some activites Pain improves with: rest, heat/ice, pacing activities and medication Relief from Meds: 3  Mobility walk without assistance how many minutes can you walk? alot ability to climb steps?  yes do you drive?  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 Cancer Mother   . Hyperlipidemia Mother   . Cancer Father   . Arthritis Father   . Hearing loss Father   . Alcohol abuse Sister   . Mental illness Sister   . Hearing loss Maternal Grandfather   . Hypertension Paternal Grandmother   . Kidney disease Paternal Grandmother    Social History   Socioeconomic History  . Marital status: Married    Spouse name: Not on file  .  Number of children: Not on file  . Years of education: Not on file  . Highest education level: Not on file  Occupational History  . Not on file  Tobacco Use  . Smoking status: Never Smoker  . Smokeless tobacco: Never Used  Substance and Sexual Activity  . Alcohol use: Yes  . Drug use: Never  . Sexual activity: Not on file  Other Topics Concern  . Not on file  Social History Narrative  . Not on file   Social Determinants of Health   Financial Resource Strain:   . Difficulty of Paying Living Expenses:   Food Insecurity:   . Worried About Charity fundraiser in the Last Year:   . Arboriculturist in the Last Year:   Transportation Needs:   . Film/video editor  (Medical):   Marland Kitchen Lack of Transportation (Non-Medical):   Physical Activity:   . Days of Exercise per Week:   . Minutes of Exercise per Session:   Stress:   . Feeling of Stress :   Social Connections:   . Frequency of Communication with Friends and Family:   . Frequency of Social Gatherings with Friends and Family:   . Attends Religious Services:   . Active Member of Clubs or Organizations:   . Attends Archivist Meetings:   Marland Kitchen Marital Status:    Past Surgical History:  Procedure Laterality Date  . ABLATION  05/16/2019  . right ankle tendon repair     Past Medical History:  Diagnosis Date  . Allergy   . Anxiety   . Asthma   . Heart murmur   . IBS (irritable bowel syndrome)   . Thyroid disease    BP 106/74   Pulse 61   Temp 98.1 F (36.7 C)   Ht 5' 5.5" (1.664 m)   Wt 162 lb 9.6 oz (73.8 kg)   SpO2 99%   BMI 26.65 kg/m   Opioid Risk Score:   Fall Risk Score:  `1  Depression screen PHQ 2/9  Depression screen PHQ 2/9 04/07/2020  Decreased Interest 0  Down, Depressed, Hopeless 0  PHQ - 2 Score 0  Altered sleeping 0  Tired, decreased energy 0  Change in appetite 0  Feeling bad or failure about yourself  0  Trouble concentrating 0  Moving slowly or fidgety/restless 0  Suicidal thoughts 0  PHQ-9 Score 0    Review of Systems  Neurological: Positive for weakness.  Psychiatric/Behavioral: The patient is nervous/anxious.   All other systems reviewed and are negative.      Objective:   Physical Exam  Awake, alert, appropriate, sitting on table, NAD Trigger points in upper traps, levators, some in scalenes and pecs, less in splenius capitus, also has trigger points in paraspinals cervical and thoracic and rhomboids as well as lumbar paraspinals esp.   MS: strength 5/5 in UEs and LEs in muscles tested deltoids, biceps, triceps, WE, grip and finger abd, HF, KE, KF, DF and PF   Decreased sensation to LT on lateral malleolus B/L (hx of R ankle surgery,  but not B/L)  Flexed cervical posture with tight pecs.       Assessment & Plan:    1. Myofascial pain Syndrme  - Skelaxin- 800 mg 2x/day as needed-   - pt has shown that she failed Flexeril because gets SO sedated, cannot stay awake.   I think other muscle relaxants are going to be in the same category- Skelaxin is the  only muscle relaxant without anticholinergic  Effects, so it doesn't cause sedation- has to stay awake for work.   2. Suggest a theracane- hold pressure- 2-4 minutes- with theracane or tennis ball on areas that have a muscle spasm- no more than 30-45 minutes.  youtube videos.   3. PT referral for myofascial release- eval and treat.    4. Showed pt pec pulls 4+ minutes and under armpit pull up pulls 4+ minutes as well as a hand trigger points- 2+ minutes.    5. F/U in 1 month- wait on Duloxetine/lyrica.  Can do trigger point injections at f/u.   I spent a total of 1 hour on visit. As detailed above.

## 2020-04-21 ENCOUNTER — Ambulatory Visit: Payer: 59 | Attending: Physical Medicine and Rehabilitation | Admitting: Physical Therapy

## 2020-04-21 ENCOUNTER — Encounter: Payer: Self-pay | Admitting: Physical Therapy

## 2020-04-21 ENCOUNTER — Other Ambulatory Visit: Payer: Self-pay

## 2020-04-21 DIAGNOSIS — R252 Cramp and spasm: Secondary | ICD-10-CM | POA: Diagnosis not present

## 2020-04-21 DIAGNOSIS — G8929 Other chronic pain: Secondary | ICD-10-CM | POA: Diagnosis not present

## 2020-04-21 DIAGNOSIS — M545 Low back pain, unspecified: Secondary | ICD-10-CM

## 2020-04-22 ENCOUNTER — Encounter: Payer: Self-pay | Admitting: Physical Therapy

## 2020-04-22 NOTE — Patient Instructions (Signed)
Access Code: [email or web address] by: Shanon Brow CarrollExercises  Supine Piriformis Stretch with Foot on Ground - 1 x daily - 7 x weekly - 3 sets - 3 reps - 20 hold  Seated Hamstring Stretch - 1 x daily - 7 x weekly - 3 sets - 10 reps  Supine Lower Trunk Rotation - 1 x daily - 7 x weekly - 3 sets - 10 reps  Seated Knee Extension with Resistance - 1 x daily - 7 x weekly - 3 sets - 10 reps  Seated Hamstring Curls with Resistance - 1 x daily - 7 x weekly - 3 sets - 10 reps  Seated Hip Abduction with Resistance - 1 x daily - 7 x weekly - 1 sets - 20 reps

## 2020-04-22 NOTE — Therapy (Signed)
[place], [place], [address] Phone: [phone]   Fax:  [phone]  Physical Therapy Evaluation  Patient Details  Name: Kelsey Coleman MRN: [id number] Date of Birth: April 07, 1971 Referring Provider (PT): Dr Courtney Heys    Encounter Date: 04/21/2020   PT End of Session - 04/22/20 1220    Visit Number 1    Number of Visits 12    Date for PT Re-Evaluation 06/03/20    Authorization Type [facility]    PT Start Time 1500    PT Stop Time 1541    PT Time Calculation (min) 41 min    Activity Tolerance Patient tolerated treatment well    Behavior During Therapy [facility] for tasks assessed/performed           Past Medical History:  Diagnosis Date  . Allergy   . Anxiety   . Asthma   . Heart murmur   . IBS (irritable bowel syndrome)   . Thyroid disease     Past Surgical History:  Procedure Laterality Date  . ABLATION  05/16/2019  . right ankle tendon repair      There were no vitals filed for this visit.    Subjective Assessment - 04/21/20 1508    Subjective Patient has had diffuse pain for over 6 years. She began with back pain in one spot. Since that point she has had increasing diffuse pain in multiple spots of her body. She was having pain only in the mornings but it has not progressed to pain all over.    Currently in Pain? Yes   No current pain sitting   Pain Score 8    with activity   Pain Location Back    Pain Orientation Right;Left   pain started on the right   Pain Descriptors / Indicators Aching    Pain Type Chronic pain    Pain Radiating Towards into upper gluteals    Pain Onset More than a month ago    Pain Frequency Intermittent    Aggravating Factors  activity/ Mornings    Pain Relieving Factors rest    Effect of Pain on Daily Activities difficulty performing ADL;s    Multiple Pain Sites Yes    Pain Score --   Patient reports when she does activy she has pain all over her body. She had a  hard time pinpointing where the pain was. With questioning she stated" it can be everywhere". Seems to be related to the body part that she is using             [facility] PT Assessment - 04/22/20 0001      Assessment   Medical Diagnosis Diffuse Pain/ Low Back Pain     Referring Provider (PT) Dr Courtney Heys     Onset Date/Surgical Date --   6 years prior    Hand Dominance Right    Next MD Visit A few weeks     Prior Therapy On her ankle       Precautions   Precautions None      Restrictions   Weight Bearing Restrictions No      Balance Screen   Has the patient fallen in the past 6 months No    Has the patient had a decrease in activity level because of a fear of falling?  No    Is the patient reluctant to leave their home because of a fear of falling?  No  Home Environment   Additional Comments 2 steps into the house       Prior Function   Level of Independence Independent    Vocation Full time employment    Vocation Requirements Works for [employer].     Leisure Working around [employer]   Overall Cognitive Status Within Functional Limits for tasks assessed    Attention Focused    Focused Attention Appears intact    Memory Appears intact    Awareness Appears intact    Problem Solving Appears intact      Observation/Other Assessments   Focus on Therapeutic Outcomes (FOTO)  46% limitation       Sensation   Light Touch Appears Intact    Additional Comments no numbness but does report her hands swell at times       Coordination   Gross Motor Movements are Fluid and Coordinated Yes    Fine Motor Movements are Fluid and Coordinated Yes      Posture/Postural Control   Posture Comments rounded shoulder and forward head       AROM   Overall AROM Comments full active ROM of hip and shoulders     AROM Assessment Site Lumbar    Lumbar Flexion 50    Lumbar - Right Rotation pain at end range     Lumbar - Left Rotation pain at end range        PROM   Overall PROM Comments full PROm of the hips       Strength   Strength Assessment Site Shoulder;Hand;Knee;Lumbar    Right/Left Shoulder Right;Left    Right Shoulder Flexion 4+/5    Right Shoulder Internal Rotation 4+/5    Right Shoulder External Rotation 4+/5    Left Shoulder Flexion 4+/5    Left Shoulder Internal Rotation 4+/5    Left Shoulder External Rotation 4+/5    Right/Left hand Right;Left    Right Hand Grip (lbs) 40    Left Hand Grip (lbs) 35      Palpation   Palpation comment mild sapasming in mod thoracic area/ low trap bilateral/ and lowr lumbar paraspinals       Ambulation/Gait   Gait Comments normal gait                       Objective measurements completed on examination: See above findings.       [facility] Adult PT Treatment/Exercise - 04/22/20 0001      Knee/Hip Exercises: Seated   Long Arc Quad Limitations 2x10 yellow     Knee/Hip Flexion 2x10 yellow     Abd/Adduction Limitations x20 yellow      Manual Therapy   Manual therapy comments reviewed how to use thera-cane                   PT Education - 04/22/20 1220    Education Details reviewed the planned progression of activity going forward; expected respose to exercises    Person(s) Educated Patient    Methods Explanation;Demonstration;Tactile cues;Verbal cues    Comprehension Verbalized understanding;Returned demonstration;Verbal cues required;Tactile cues required            PT Short Term Goals - 04/22/20 1228      PT SHORT TERM GOAL #1   Title Patient will increase pain free lumbar spine flexion by 15 degrees    Time 3    Period Weeks    Status New  Target Date 05/13/20      PT SHORT TERM GOAL #2   Title Patient will inxrease gross UE strength to 5/5    Time 3    Period Weeks    Status New    Target Date 05/13/20      PT SHORT TERM GOAL #3   Title Patient will be independent with basic HEP wiuthout a signifcant increase in pain    Time 3    Period  Weeks    Status New    Target Date 05/13/20             PT Long Term Goals - 04/22/20 1232      PT LONG TERM GOAL #1   Title Patient will have an entire program of exercises and progression of activity to help her be able to perfrom activity.    Time 6    Period Weeks    Status New    Target Date 06/03/20      PT LONG TERM GOAL #2   Title Patient will perfrom daily housework without increased pain    Time 6    Period Weeks    Status New    Target Date 06/03/20                  Plan - 04/22/20 1222    Clinical Impression Statement Patient is a 49 year old female with low back pain and diffuse body pain that increases with activity. She has mild trigger points in her mid thoracic are and lower lumbar spine.  She has limited spine mobility with flexion and bilateral rotation. her UE and LE motion is WNL. She shouws mild strength deficits in the UE. She has tried exercisesing before and had an increase in pain. therapy gave her light stretching first and reviewed use of her thera-cane. She was adivsed if something works well to continue with it. If it dosent cross it off and we will adjust. She was given a light LE strengthening series. We will continue to progress as tolerated.    Personal Factors and Comorbidities Time since onset of injury/illness/exacerbation;Comorbidity 1    Comorbidities anxiety    Examination-Activity Limitations Bend;Lift;Caring for Others;Locomotion Level;Carry;Dressing;Bed Mobility    Examination-Participation Restrictions Occupation;Laundry;Cleaning;Community Activity;Meal Prep;Shop    Stability/Clinical Decision Making Evolving/Moderate complexity    Clinical Decision Making Moderate    Rehab Potential Fair    PT Frequency 1x / week    PT Duration 6 weeks    PT Treatment/Interventions ADLs/Self Care Home Management;Canalith Repostioning;Cryotherapy;Electrical Stimulation;Moist Heat;Ultrasound;DME Instruction;Therapeutic activities;Functional  mobility training;Therapeutic exercise;Neuromuscular re-education;Patient/family education;Manual techniques;Passive range of motion;Dry needling;Taping    PT Next Visit Plan add serier of exercises in as tolerated, Manual therapy PRN, consider lumbar LAD; light cardiovasuclar training with progression; PNE ?    PT Home Exercise Plan laq, hamstring curl, yellow t-band    Consulted and Agree with Plan of Care Patient           Patient will benefit from skilled therapeutic intervention in order to improve the following deficits and impairments:  Decreased endurance, Pain, Decreased activity tolerance, Decreased strength  Visit Diagnosis: Chronic bilateral low back pain without sciatica  Cramp and spasm     Problem List Patient Active Problem List   Diagnosis Date Noted  . Benign essential microscopic hematuria 10/30/2019  . Uterine fibroid 03/18/2019  . At high risk for breast cancer 03/07/2019  . Disorder of thyroid gland 03/04/2019  . Anxiety 03/04/2019  . Hypermethioninemia ([place])  01/30/2019  . Hematuria of unknown cause 03/27/2017  . Mild persistent asthma 06/15/2015  . Allergic rhinitis due to pollen 06/15/2015  . Oral allergy syndrome 06/15/2015  . Lactose intolerance in adult 06/15/2015  . Allergic rhinitis 06/11/2015  . GERD (gastroesophageal reflux disease) 06/11/2015  . Avitaminosis D 02/04/2015  . Encounter for general adult medical examination without abnormal findings 01/23/2014  . Adaptive colitis 01/03/2013  . Cardiac murmur 01/03/2013  . 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 Nasal cavity polyp 01/03/2013  . Benign cyst of right kidney 08/28/2009    Carney Living PT DPT  04/22/2020, 12:39 PM  [facility] [address] [place], [place], [address] Phone: [phone]   Fax:  [phone]  Name: Kelsey Coleman MRN: [id number] Date of Birth: 06/29/1971

## 2020-04-29 ENCOUNTER — Other Ambulatory Visit: Payer: Self-pay

## 2020-04-29 ENCOUNTER — Encounter: Payer: Self-pay | Admitting: Physical Therapy

## 2020-04-29 ENCOUNTER — Ambulatory Visit: Payer: 59 | Attending: Physical Medicine and Rehabilitation | Admitting: Physical Therapy

## 2020-04-29 DIAGNOSIS — G8929 Other chronic pain: Secondary | ICD-10-CM | POA: Insufficient documentation

## 2020-04-29 DIAGNOSIS — R252 Cramp and spasm: Secondary | ICD-10-CM | POA: Insufficient documentation

## 2020-04-29 DIAGNOSIS — M545 Low back pain, unspecified: Secondary | ICD-10-CM

## 2020-04-29 NOTE — Therapy (Signed)
[place], [place], [address] Phone: [phone]   Fax:  [phone]  Physical Therapy Treatment  Patient Details  Name: Kelsey Coleman MRN: [id number] Date of Birth: 1971/01/20 Referring Provider (PT): Dr Courtney Heys    Encounter Date: 04/29/2020   PT End of Session - 04/29/20 1237    Visit Number 2    Number of Visits 12    Date for PT Re-Evaluation 06/03/20    Authorization Type [facility]    PT Start Time 1230    PT Stop Time 1310    PT Time Calculation (min) 40 min           Past Medical History:  Diagnosis Date  . Allergy   . Anxiety   . Asthma   . Heart murmur   . IBS (irritable bowel syndrome)   . Thyroid disease     Past Surgical History:  Procedure Laterality Date  . ABLATION  05/16/2019  . right ankle tendon repair      There were no vitals filed for this visit.   Subjective Assessment - 04/29/20 1233    Subjective No pain on arrival. Has been doing exercises and they were harder than expected. She reports feeling soreness with HEP that did get better.    Currently in Pain? No/denies                             [facility] Adult PT Treatment/Exercise - 04/29/20 0001      Knee/Hip Exercises: Stretches   Other Knee/Hip Stretches LTR, piriformis stretches x 2 each       Knee/Hip Exercises: Aerobic   Nustep L1 UE/LE x 5 minutes    4/10 right lower back and buttock     Knee/Hip Exercises: Seated   Long Arc Quad Limitations 3x10 yellow     Knee/Hip Flexion 3x10 yellow    knee   Abd/Adduction Limitations x30 yellow      Shoulder Exercises: Supine   Other Supine Exercises supine yellow band scap stab series                   PT Education - 04/29/20 1302    Education Details HEP    Person(s) Educated Patient    Methods Explanation;Handout    Comprehension Verbalized understanding            PT Short Term Goals - 04/22/20 1228      PT SHORT TERM  GOAL #1   Title Patient will increase pain free lumbar spine flexion by 15 degrees    Time 3    Period Weeks    Status New    Target Date 05/13/20      PT SHORT TERM GOAL #2   Title Patient will inxrease gross UE strength to 5/5    Time 3    Period Weeks    Status New    Target Date 05/13/20      PT SHORT TERM GOAL #3   Title Patient will be independent with basic HEP wiuthout a signifcant increase in pain    Time 3    Period Weeks    Status New    Target Date 05/13/20             PT Long Term Goals - 04/22/20 1232      PT LONG TERM GOAL #1   Title Patient will have an entire program  of exercises and progression of activity to help her be able to perfrom activity.    Time 6    Period Weeks    Status New    Target Date 06/03/20      PT LONG TERM GOAL #2   Title Patient will perfrom daily housework without increased pain    Time 6    Period Weeks    Status New    Target Date 06/03/20                 Plan - 04/29/20 1240    Clinical Impression Statement Pt reports severe soreness after HEP for 2 days. She has continued her HEP and is less sore now. No pain on arrival. Began Nustep at Level one and she reported pain at 4/10 in right low back. She was able to complete Nustep for 7 minutes and her pain remained 4/10. Reviewed HEP and added UE strength series in supine. At end of session she had no pain but thought she might be sore.    PT Next Visit Plan add series of exercises in as tolerated, Manual therapy PRN, consider lumbar LAD; light cardiovasuclar training with progression; PNE ?    PT Home Exercise Plan laq, hamstring curl, clam yellow t-band, seated h/s stretch, LTR and piriformis stretch in supine;    Consulted and Agree with Plan of Care Patient           Patient will benefit from skilled therapeutic intervention in order to improve the following deficits and impairments:  Decreased endurance, Pain, Decreased activity tolerance, Decreased  strength  Visit Diagnosis: Chronic bilateral low back pain without sciatica  Cramp and spasm     Problem List Patient Active Problem List   Diagnosis Date Noted  . Benign essential microscopic hematuria 10/30/2019  . Uterine fibroid 03/18/2019  . At high risk for breast cancer 03/07/2019  . Disorder of thyroid gland 03/04/2019  . Anxiety 03/04/2019  . Hypermethioninemia ([place]) 01/30/2019  . Hematuria of unknown cause 03/27/2017  . Mild persistent asthma 06/15/2015  . Allergic rhinitis due to pollen 06/15/2015  . Oral allergy syndrome 06/15/2015  . Lactose intolerance in adult 06/15/2015  . Allergic rhinitis 06/11/2015  . GERD (gastroesophageal reflux disease) 06/11/2015  . Avitaminosis D 02/04/2015  . Encounter for general adult medical examination without abnormal findings 01/23/2014  . Adaptive colitis 01/03/2013  . Cardiac murmur 01/03/2013  . 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 Nasal cavity polyp 01/03/2013  . Benign cyst of right kidney 08/28/2009    Dorene Ar, PTA 04/29/2020, 1:16 PM  [facility] [address] [place], [place], [address] Phone: [phone]   Fax:  [phone]  Name: TARAYA STEWARD MRN: [id number] Date of Birth: 1970/09/15

## 2020-04-29 NOTE — Patient Instructions (Signed)
  Side Pull: Double Arm   On back, knees bent, feet flat. Arms perpendicular to body, shoulder level, elbows straight but relaxed. Pull arms out to sides, elbows straight. Resistance band comes across collarbones, hands toward floor. Hold momentarily. Slowly return to starting position. Repeat _10-20__ times. Band color __Y___   Sash   On back, knees bent, feet flat, left hand on left hip, right hand above left. Pull right arm DIAGONALLY (hip to shoulder) across chest. Bring right arm along head toward floor. Hold momentarily. Slowly return to starting position. Repeat 10-20___ times. Do with left arm. Band color ___Y___   Shoulder Rotation: Double Arm   On back, knees bent, feet flat, elbows tucked at sides, bent 90, hands palms up. Pull hands apart and down toward floor, keeping elbows near sides. Hold momentarily. Slowly return to starting position. Repeat _10-20__ times. Band color __Y____   Over Head Pull: Narrow Grip       On back, knees bent, feet flat, band across thighs, elbows straight but relaxed. Pull hands apart (start). Keeping elbows straight, bring arms up and over head, hands toward floor. Keep pull steady on band. Hold momentarily. Return slowly, keeping pull steady, back to start. Repeat ___10-20 times. Band color ___Y___

## 2020-04-30 ENCOUNTER — Encounter: Payer: Self-pay | Admitting: Internal Medicine

## 2020-05-05 ENCOUNTER — Ambulatory Visit: Payer: 59 | Admitting: Physical Therapy

## 2020-05-05 ENCOUNTER — Encounter: Payer: Self-pay | Admitting: Physical Therapy

## 2020-05-05 ENCOUNTER — Other Ambulatory Visit: Payer: Self-pay

## 2020-05-05 ENCOUNTER — Ambulatory Visit: Payer: 59 | Admitting: Physical Medicine and Rehabilitation

## 2020-05-05 DIAGNOSIS — R252 Cramp and spasm: Secondary | ICD-10-CM | POA: Diagnosis not present

## 2020-05-05 DIAGNOSIS — M545 Low back pain, unspecified: Secondary | ICD-10-CM

## 2020-05-05 DIAGNOSIS — G8929 Other chronic pain: Secondary | ICD-10-CM | POA: Diagnosis not present

## 2020-05-05 NOTE — Therapy (Signed)
[place], [place], [address] Phone: [phone]   Fax:  [phone]  Physical Therapy Treatment  Patient Details  Name: Kelsey Coleman MRN: [id number] Date of Birth: [birth year] Referring Provider (PT): Dr Courtney Heys    Encounter Date: 05/05/2020   PT End of Session - 05/05/20 1322    Visit Number 3    Number of Visits 12    Date for PT Re-Evaluation 06/03/20    Authorization Type [facility]    PT Start Time 1315    PT Stop Time 1751    PT Time Calculation (min) 38 min           Past Medical History:  Diagnosis Date  . Allergy   . Anxiety   . Asthma   . Heart murmur   . IBS (irritable bowel syndrome)   . Thyroid disease     Past Surgical History:  Procedure Laterality Date  . ABLATION  05/16/2019  . right ankle tendon repair      There were no vitals filed for this visit.   Subjective Assessment - 05/05/20 1319    Subjective Was a little sore from the UE exercises, noted soreness across top of shoulders for a couple of days. Back is a little sore maybe from picking up a case of water.    Currently in Pain? Yes    Pain Score 4     Pain Location Back    Pain Orientation Right;Lower    Pain Descriptors / Indicators Aching    Aggravating Factors  lifting    Pain Relieving Factors rest                             [facility] Adult PT Treatment/Exercise - 05/05/20 0001      Knee/Hip Exercises: Stretches   Other Knee/Hip Stretches LTR, piriformis stretches x 2 each       Knee/Hip Exercises: Aerobic   Nustep L2 UE/LE x 6 minutes    4/10 right lower back and buttock     Knee/Hip Exercises: Seated   Long Arc Quad Limitations 3x10 red     Knee/Hip Flexion 3x10 red    knee   Marching Limitations red band x 10     Abd/Adduction Limitations x30 red       Knee/Hip Exercises: Supine   Other Supine Knee/Hip Exercises pelvic tilt x 10       Shoulder Exercises: Supine   Other  Supine Exercises supine red  band scap stab series x 10       Shoulder Exercises: Stretch   Other Shoulder Stretches upper trap and levator stretches                     PT Short Term Goals - 04/22/20 1228      PT SHORT TERM GOAL #1   Title Patient will increase pain free lumbar spine flexion by 15 degrees    Time 3    Period Weeks    Status New    Target Date 05/13/20      PT SHORT TERM GOAL #2   Title Patient will inxrease gross UE strength to 5/5    Time 3    Period Weeks    Status New    Target Date 05/13/20      PT SHORT TERM GOAL #3   Title Patient will be independent with basic HEP wiuthout  a signifcant increase in pain    Time 3    Period Weeks    Status New    Target Date 05/13/20             PT Long Term Goals - 04/22/20 1232      PT LONG TERM GOAL #1   Title Patient will have an entire program of exercises and progression of activity to help her be able to perfrom activity.    Time 6    Period Weeks    Status New    Target Date 06/03/20      PT LONG TERM GOAL #2   Title Patient will perfrom daily housework without increased pain    Time 6    Period Weeks    Status New    Target Date 06/03/20                 Plan - 05/05/20 1329    Clinical Impression Statement Pt reports compliance with HEP. No change in pain or function and she reports the exercises are still painful, LAQs hurt the worst. Reviewed HEP using red band today. Added gentle core strengthening. She noted pain in upper trap after starting UE exercises. She was instructed in neck stretches to reduce soreness.    PT Next Visit Plan add series of exercises in as tolerated, Manual therapy PRN, consider lumbar LAD; light cardiovasuclar training with progression; PNE?    PT Home Exercise Plan laq, hamstring curl, clam yellow t-band, seated h/s stretch, LTR and piriformis stretch in supine;           Patient will benefit from skilled therapeutic intervention in order to  improve the following deficits and impairments:  Decreased endurance, Pain, Decreased activity tolerance, Decreased strength  Visit Diagnosis: Chronic bilateral low back pain without sciatica  Cramp and spasm     Problem List Patient Active Problem List   Diagnosis Date Noted  . Benign essential microscopic hematuria 10/30/2019  . Uterine fibroid 03/18/2019  . At high risk for breast cancer 03/07/2019  . Disorder of thyroid gland 03/04/2019  . Anxiety 03/04/2019  . 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 Hematuria of unknown cause 03/27/2017  . Mild persistent asthma 06/15/2015  . Allergic rhinitis due to pollen 06/15/2015  . Oral allergy syndrome 06/15/2015  . Lactose intolerance in adult 06/15/2015  . Allergic rhinitis 06/11/2015  . GERD (gastroesophageal reflux disease) 06/11/2015  . Avitaminosis D 02/04/2015  . Encounter for general adult medical examination without abnormal findings 01/23/2014  . Adaptive colitis 01/03/2013  . Cardiac murmur 01/03/2013  . 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 Nasal cavity polyp 01/03/2013  . Benign cyst of right kidney 08/28/2009    Dorene Ar, PTA 05/05/2020, 1:47 PM  [place], [place], [address] Phone: [phone]   Fax:  [phone]  Name: Kelsey Coleman MRN: [id number] Date of Birth: 11/17/1970

## 2020-05-13 ENCOUNTER — Other Ambulatory Visit: Payer: Self-pay

## 2020-05-13 ENCOUNTER — Ambulatory Visit: Payer: 59 | Admitting: Physical Therapy

## 2020-05-13 ENCOUNTER — Encounter: Payer: Self-pay | Admitting: Physical Therapy

## 2020-05-13 DIAGNOSIS — M545 Low back pain, unspecified: Secondary | ICD-10-CM

## 2020-05-13 DIAGNOSIS — R252 Cramp and spasm: Secondary | ICD-10-CM

## 2020-05-13 DIAGNOSIS — G8929 Other chronic pain: Secondary | ICD-10-CM

## 2020-05-13 NOTE — Patient Instructions (Signed)
Access Code: [email or web address] URL: [email or web address] Date: 05/13/2020 Prepared by: Carolyne Littles  Exercises .Mini Squat with Counter Support - 1 x daily - 7 x weekly - 3 sets - 10 reps .Standing Hip Abduction with Counter Support - 1 x daily - 7 x weekly - 3 sets - 10 reps .Heel rises with counter support - 1 x daily - 7 x weekly - 3 sets - 10 reps .Standing Hip Extension with Counter Support - 1 x daily - 7 x weekly - 3 sets - 10 reps .Standing March with Counter Support - 1 x daily - 7 x weekly - 3 sets - 10 reps

## 2020-05-13 NOTE — Therapy (Signed)
[place], [place], [address] Phone: [phone]   Fax:  [phone]  Physical Therapy Treatment  Patient Details  Name: Kelsey Coleman MRN: [id number] Date of Birth: 1971/02/12 Referring Provider (PT): Dr Courtney Heys    Encounter Date: 05/13/2020   PT End of Session - 05/13/20 1120    Visit Number 4    Number of Visits 12    Date for PT Re-Evaluation 06/03/20    Authorization Type [facility]    PT Start Time 1100    PT Stop Time 1140    PT Time Calculation (min) 40 min    Activity Tolerance Patient tolerated treatment well    Behavior During Therapy [facility] for tasks assessed/performed           Past Medical History:  Diagnosis Date  . Allergy   . Anxiety   . Asthma   . Heart murmur   . IBS (irritable bowel syndrome)   . Thyroid disease     Past Surgical History:  Procedure Laterality Date  . ABLATION  05/16/2019  . right ankle tendon repair      There were no vitals filed for this visit.   Subjective Assessment - 05/13/20 1106    Subjective Patient rpeorts increased lower back pain today. She feels that overall she has had an increase in left sided low back pain.    Limitations Lifting;Standing    Patient Stated Goals to have less pain    Currently in Pain? Yes    Pain Score 5     Pain Location Back    Pain Orientation Right;Lower    Pain Descriptors / Indicators Aching    Pain Type Chronic pain    Pain Radiating Towards into the left gluteal    Pain Onset More than a month ago    Pain Frequency Intermittent    Aggravating Factors  lifting    Pain Relieving Factors rest    Effect of Pain on Daily Activities difficulty perfroming ADL's    Multiple Pain Sites No                             [facility] Adult PT Treatment/Exercise - 05/13/20 0001      Knee/Hip Exercises: Stretches   Active Hamstring Stretch Limitations seated hamstring stretch 3x20 sec hold     Other  Knee/Hip Stretches LTR, piriformis stretches x 2 each       Knee/Hip Exercises: Aerobic   Nustep L2 UE/LE x 6 minutes    4/10 right lower back and buttock     Knee/Hip Exercises: Standing   Heel Raises Limitations x20     Hip Flexion Limitations standing slow march x10 each leg     Hip Abduction 1 set;15 reps;Left;Right    Hip Extension 1 set;15 reps;Right;Left    Functional Squat Limitations timespent reviewing proper squat. Patient unable to complete with good technique. Patient reported popping in her knees.       Knee/Hip Exercises: Supine   Other Supine Knee/Hip Exercises pelvic tilt x 10                   PT Education - 05/13/20 1108    Education Details updated HEP    Person(s) Educated Patient    Methods Explanation;Verbal cues;Tactile cues;Demonstration    Comprehension Verbalized understanding;Returned demonstration;Verbal cues required;Tactile cues required  PT Short Term Goals - 04/22/20 1228      PT SHORT TERM GOAL #1   Title Patient will increase pain free lumbar spine flexion by 15 degrees    Time 3    Period Weeks    Status New    Target Date 05/13/20      PT SHORT TERM GOAL #2   Title Patient will inxrease gross UE strength to 5/5    Time 3    Period Weeks    Status New    Target Date 05/13/20      PT SHORT TERM GOAL #3   Title Patient will be independent with basic HEP wiuthout a signifcant increase in pain    Time 3    Period Weeks    Status New    Target Date 05/13/20             PT Long Term Goals - 04/22/20 1232      PT LONG TERM GOAL #1   Title Patient will have an entire program of exercises and progression of activity to help her be able to perfrom activity.    Time 6    Period Weeks    Status New    Target Date 06/03/20      PT LONG TERM GOAL #2   Title Patient will perfrom daily housework without increased pain    Time 6    Period Weeks    Status New    Target Date 06/03/20                  Plan - 05/13/20 1529    Clinical Impression Statement Patient did not seem to have increased pain in her lower back with treatment. Therapy added in standing exercises. She seemed to tolerate well. Therapy spoke with her about trying to differentiate between muscle burn, stretch, and actual pain. At this time she has several series of exercises. It does not appear that she is any better or any worse. She is working on her exercises at home. We reviewed proper lift technqe with suqats today. We will practice lifting technique next visit. We will re-asess the plan next visit. We can give it another few vvisits but we hope to at least start to see some improvement in function and pain. Therapy assessed trigger points in her back today. She had very few. she reported the pain is deeper.    Examination-Activity Limitations Bend;Lift;Caring for Others;Locomotion Level;Carry;Dressing;Bed Mobility    Examination-Participation Restrictions Occupation;Laundry;Cleaning;Community Activity;Meal Prep;Shop    Stability/Clinical Decision Making Evolving/Moderate complexity    Clinical Decision Making Moderate    Rehab Potential Fair    PT Frequency 1x / week    PT Duration 6 weeks    PT Treatment/Interventions ADLs/Self Care Home Management;Canalith Repostioning;Cryotherapy;Electrical Stimulation;Moist Heat;Ultrasound;DME Instruction;Therapeutic activities;Functional mobility training;Therapeutic exercise;Neuromuscular re-education;Patient/family education;Manual techniques;Passive range of motion;Dry needling;Taping    PT Next Visit Plan add series of exercises in as tolerated, Manual therapy PRN, consider lumbar LAD; light cardiovasuclar training with progression; PNE?    PT Home Exercise Plan laq, hamstring curl, clam yellow t-band, seated h/s stretch, LTR and piriformis stretch in supine;    Consulted and Agree with Plan of Care Patient           Patient will benefit from skilled therapeutic intervention in  order to improve the following deficits and impairments:  Decreased endurance, Pain, Decreased activity tolerance, Decreased strength  Visit Diagnosis: Chronic bilateral low back pain without sciatica  Cramp and spasm  Problem List Patient Active Problem List   Diagnosis Date Noted  . Benign essential microscopic hematuria 10/30/2019  . Uterine fibroid 03/18/2019  . At high risk for breast cancer 03/07/2019  . Disorder of thyroid gland 03/04/2019  . Anxiety 03/04/2019  . 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 Hematuria of unknown cause 03/27/2017  . Mild persistent asthma 06/15/2015  . Allergic rhinitis due to pollen 06/15/2015  . Oral allergy syndrome 06/15/2015  . Lactose intolerance in adult 06/15/2015  . Allergic rhinitis 06/11/2015  . GERD (gastroesophageal reflux disease) 06/11/2015  . Avitaminosis D 02/04/2015  . Encounter for general adult medical examination without abnormal findings 01/23/2014  . Adaptive colitis 01/03/2013  . Cardiac murmur 01/03/2013  . 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 Nasal cavity polyp 01/03/2013  . Benign cyst of right kidney 08/28/2009    Carney Living 05/13/2020, 4:59 PM  [place], [place], [address] Phone: [phone]   Fax:  [phone]  Name: Kelsey Coleman MRN: [id number] Date of Birth: Oct 04, 1970

## 2020-05-19 ENCOUNTER — Ambulatory Visit: Payer: 59 | Admitting: Physical Therapy

## 2020-05-19 ENCOUNTER — Other Ambulatory Visit: Payer: Self-pay

## 2020-05-19 ENCOUNTER — Encounter: Payer: Self-pay | Admitting: Physical Therapy

## 2020-05-19 DIAGNOSIS — M545 Low back pain, unspecified: Secondary | ICD-10-CM

## 2020-05-19 DIAGNOSIS — R252 Cramp and spasm: Secondary | ICD-10-CM

## 2020-05-19 DIAGNOSIS — G8929 Other chronic pain: Secondary | ICD-10-CM | POA: Diagnosis not present

## 2020-05-20 NOTE — Therapy (Signed)
[place], [place], [address] Phone: [phone]   Fax:  [phone]  Physical Therapy Treatment/Discharge   Patient Details  Name: Kelsey Coleman MRN: [id number] Date of Birth: [birth year] Referring Provider (PT): Dr Courtney Heys    Encounter Date: 05/19/2020   PT End of Session - 05/19/20 1450    Visit Number 5    Number of Visits 12    Date for PT Re-Evaluation 06/03/20    PT Start Time 4401    PT Stop Time 1457    PT Time Calculation (min) 42 min    Activity Tolerance Patient tolerated treatment well    Behavior During Therapy [facility] for tasks assessed/performed           Past Medical History:  Diagnosis Date  . Allergy   . Anxiety   . Asthma   . Heart murmur   . IBS (irritable bowel syndrome)   . Thyroid disease     Past Surgical History:  Procedure Laterality Date  . ABLATION  05/16/2019  . right ankle tendon repair      There were no vitals filed for this visit.   Subjective Assessment - 05/19/20 1442    Subjective Patient went hiking and did some climbing. She was so sore she could not walk for 2 days. After 2 days it improved. She isn't having much pain at this time.    Limitations Lifting;Standing    Currently in Pain? No/denies              [facility] PT Assessment - 05/20/20 0001      Assessment   Medical Diagnosis Diffuse Pain/ Low Back Pain     Referring Provider (PT) Dr Courtney Heys                          [facility] Adult PT Treatment/Exercise - 05/20/20 0001      Self-Care   Self-Care Other Self-Care Comments    Other Self-Care Comments  reviewed characteristics of DOMS, difference between stretch and pain;  expected time frame of DOMS, Reviewed proper progression of exercises.       Knee/Hip Exercises: Stretches   Active Hamstring Stretch Limitations reviewed . Patient continues to have pain.     Other Knee/Hip Stretches LTR, piriformis stretches x 2 each        Knee/Hip Exercises: Aerobic   Nustep L2 UE/LE x 5 minutes    4/10 right lower back and buttock     Knee/Hip Exercises: Standing   Functional Squat Limitations more time psent reviewing proper squat. Patient put close to the counter to prevent anterior translation of the knees. Seat put behind her for cuing. Patient put at the ocunter to prevent posteriior loss of  balance 2x5. Advised not to over-do this at home but to practice it    Other Standing Knee Exercises scap retraction x20 red with breathing, late pulld won x20 red; treviewed triceps and bicpes strengthening with abddminal breathing.       Knee/Hip Exercises: Supine   Other Supine Knee/Hip Exercises pelvic tilt x 10     Other Supine Knee/Hip Exercises supine march x10 with progression; supine bridge with cuing for progression                   PT Education - 05/19/20 1449    Education Details reviewed final HEP and progression of exercises    Person(s) Educated Patient  Methods Explanation;Demonstration;Tactile cues;Verbal cues    Comprehension Verbalized understanding;Returned demonstration;Verbal cues required;Tactile cues required            PT Short Term Goals - 05/20/20 1032      PT SHORT TERM GOAL #1   Title Patient will increase pain free lumbar spine flexion by 15 degrees    Time 3    Period Weeks    Status On-going    Target Date 05/13/20      PT SHORT TERM GOAL #2   Title Patient will increase gross UE strength to 5/5    Time 3    Period Weeks    Status On-going    Target Date 05/13/20      PT SHORT TERM GOAL #3   Title Patient will be independent with basic HEP wiuthout a signifcant increase in pain    Period Weeks    Status On-going    Target Date 05/13/20             PT Long Term Goals - 05/20/20 1043      PT LONG TERM GOAL #1   Title Patient will have an entire program of exercises and progression of activity to help her be able to perfrom activity.    Baseline has program     Time 6    Period Weeks    Status Achieved      PT LONG TERM GOAL #2   Title Patient will perfrom daily housework without increased pain    Baseline continues to have increased pain    Time 6    Period Weeks    Status Not Met                 Plan - 05/20/20 1027    Clinical Impression Statement Patients goal for therapy was to work on obtaining and exercise program that dosent cause her a significant increase in pain. At this time she has a compelte program. She has been complaint with her exercises. She doesnot appear to be much better. She still has pain in different places at different times. It is difficult to tell if some of the pain is DOMS 2nd to exercise and actiuvity. She will continue with her exercises on her own. Her FOTO score decreased 2%. At this time D/C to HEP.    Personal Factors and Comorbidities Time since onset of injury/illness/exacerbation;Comorbidity 1    Comorbidities anxiety    Examination-Activity Limitations Bend;Lift;Caring for Others;Locomotion Level;Carry;Dressing;Bed Mobility    Examination-Participation Restrictions Occupation;Laundry;Cleaning;Community Activity;Meal Prep;Shop    Stability/Clinical Decision Making Evolving/Moderate complexity    Clinical Decision Making Moderate    Rehab Potential Fair    PT Frequency 1x / week    PT Duration 6 weeks    PT Treatment/Interventions ADLs/Self Care Home Management;Canalith Repostioning;Cryotherapy;Electrical Stimulation;Moist Heat;Ultrasound;DME Instruction;Therapeutic activities;Functional mobility training;Therapeutic exercise;Neuromuscular re-education;Patient/family education;Manual techniques;Passive range of motion;Dry needling;Taping    PT Next Visit Plan add series of exercises in as tolerated, Manual therapy PRN, consider lumbar LAD; light cardiovasuclar training with progression; PNE?    PT Home Exercise Plan laq, hamstring curl, clam yellow t-band, seated h/s stretch, LTR and piriformis  stretch in supine;    Consulted and Agree with Plan of Care Patient           Patient will benefit from skilled therapeutic intervention in order to improve the following deficits and impairments:     Visit Diagnosis: Chronic bilateral low back pain without sciatica  Cramp and spasm PHYSICAL THERAPY DISCHARGE  SUMMARY  Visits from Start of Care: 5  Current functional level related to goals / functional outcomes: No significant improvement in pain but has an exercise program that dosent increase her pain   Remaining deficits: Continues to have pain diffuse pain in multiple areas    Education / Equipment: HEP Plan: Patient agrees to discharge.  Patient goals were partially met. Patient is being discharged due to being pleased with the current functional level.  ?????       Problem List Patient Active Problem List   Diagnosis Date Noted  . Benign essential microscopic hematuria 10/30/2019  . Uterine fibroid 03/18/2019  . At high risk for breast cancer 03/07/2019  . Disorder of thyroid gland 03/04/2019  . Anxiety 03/04/2019  . 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 Hematuria of unknown cause 03/27/2017  . Mild persistent asthma 06/15/2015  . Allergic rhinitis due to pollen 06/15/2015  . Oral allergy syndrome 06/15/2015  . Lactose intolerance in adult 06/15/2015  . Allergic rhinitis 06/11/2015  . GERD (gastroesophageal reflux disease) 06/11/2015  . Avitaminosis D 02/04/2015  . Encounter for general adult medical examination without abnormal findings 01/23/2014  . Adaptive colitis 01/03/2013  . Cardiac murmur 01/03/2013  . 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 Nasal cavity polyp 01/03/2013  . Benign cyst of right kidney 08/28/2009    Carney Living PT DPT   05/20/2020, 10:44 AM  [place], [place], [address] Phone: [phone]   Fax:  [phone]  Name: Kelsey Coleman MRN:  [id number] Date of Birth: 1971/06/16

## 2020-06-02 ENCOUNTER — Other Ambulatory Visit: Payer: Self-pay

## 2020-06-02 ENCOUNTER — Encounter: Payer: Self-pay | Admitting: Physical Medicine and Rehabilitation

## 2020-06-02 ENCOUNTER — Other Ambulatory Visit: Payer: Self-pay | Admitting: Family Medicine

## 2020-06-02 ENCOUNTER — Encounter: Payer: 59 | Attending: Physical Medicine and Rehabilitation | Admitting: Physical Medicine and Rehabilitation

## 2020-06-02 VITALS — BP 115/78 | HR 68 | Temp 98.5°F | Ht 65.5 in | Wt 162.0 lb

## 2020-06-02 DIAGNOSIS — M7918 Myalgia, other site: Secondary | ICD-10-CM

## 2020-06-02 MED FILL — ALPRAZolam 0.5 MG TABS: 0.5 | 30 days supply | Qty: 30 | Fill #0

## 2020-06-02 NOTE — Progress Notes (Signed)
Subjective:    Patient ID: Kelsey Coleman, female    DOB: 1971/07/24, 49 y.o.   MRN: [id number]  HPI   Insurance covered Skelaxin.  Did it for 4 days- didn't see any change. None at all.   2 incidents since last seen and didn't help pain at all.  Got theracane- used a week straight- week prior to therapy.    Only time in 6 years didn't have pain was double dose of Darvocet.     Pain Inventory Average Pain 3 Pain Right Now 3 My pain is intermittent, dull and aching  In the last 24 hours, has pain interfered with the following? General activity 5 Relation with others 4 Enjoyment of life 4 What TIME of day is your pain at its worst? varies Sleep (in general) Good  Pain is worse with: some activites Pain improves with: rest and heat/ice Relief from Meds: 0  Family History  Problem Relation Age of Onset  . Cancer Mother   . Hyperlipidemia Mother   . Cancer Father   . Arthritis Father   . Hearing loss Father   . Alcohol abuse Sister   . Mental illness Sister   . Hearing loss Maternal Grandfather   . Hypertension Paternal Grandmother   . Kidney disease Paternal Grandmother    Social History   Socioeconomic History  . Marital status: Married    Spouse name: Not on file  . Number of children: Not on file  . Years of education: Not on file  . Highest education level: Not on file  Occupational History  . Not on file  Tobacco Use  . Smoking status: Never Smoker  . Smokeless tobacco: Never Used  Substance and Sexual Activity  . Alcohol use: Yes  . Drug use: Never  . Sexual activity: Not on file  Other Topics Concern  . Not on file  Social History Narrative  . Not on file   Social Determinants of Health   Financial Resource Strain:   . Difficulty of Paying Living Expenses: Not on file  Food Insecurity:   . Worried About Charity fundraiser in the Last Year: Not on file  . Ran Out of Food in the Last Year: Not on file  Transportation Needs:   . Lack of  Transportation (Medical): Not on file  . Lack of Transportation (Non-Medical): Not on file  Physical Activity:   . Days of Exercise per Week: Not on file  . Minutes of Exercise per Session: Not on file  Stress:   . Feeling of Stress : Not on file  Social Connections:   . Frequency of Communication with Friends and Family: Not on file  . Frequency of Social Gatherings with Friends and Family: Not on file  . Attends Religious Services: Not on file  . Active Member of Clubs or Organizations: Not on file  . Attends Archivist Meetings: Not on file  . Marital Status: Not on file   Past Surgical History:  Procedure Laterality Date  . ABLATION  05/16/2019  . right ankle tendon repair     Past Surgical History:  Procedure Laterality Date  . ABLATION  05/16/2019  . right ankle tendon repair     Past Medical History:  Diagnosis Date  . Allergy   . Anxiety   . Asthma   . Heart murmur   . IBS (irritable bowel syndrome)   . Thyroid disease    BP 115/78   Pulse 68  Temp 98.5 F (36.9 C)   Ht 5' 5.5" (1.664 m)   Wt 162 lb (73.5 kg)   SpO2 96%   BMI 26.55 kg/m   Opioid Risk Score:   Fall Risk Score:  `1  Depression screen PHQ 2/9  Depression screen PHQ 2/9 04/07/2020  Decreased Interest 0  Down, Depressed, Hopeless 0  PHQ - 2 Score 0  Altered sleeping 0  Tired, decreased energy 0  Change in appetite 0  Feeling bad or failure about yourself  0  Trouble concentrating 0  Moving slowly or fidgety/restless 0  Suicidal thoughts 0  PHQ-9 Score 0    Review of Systems  Constitutional: Negative.   HENT: Negative.   Eyes: Negative.   Respiratory: Negative.   Cardiovascular: Negative.   Gastrointestinal: Negative.   Endocrine: Negative.   Genitourinary: Negative.   Musculoskeletal: Positive for arthralgias.  Skin: Negative.   Allergic/Immunologic: Negative.   Neurological: Negative.   Hematological: Negative.   Psychiatric/Behavioral: Negative.   All other  systems reviewed and are negative.      Objective:   Physical Exam Sitting up on table, more comfortable, NAD Trigger points felt in B/L upper traps, scalenes, rhomboids, as well as R lumbar paraspinals and thoracic paraspinals       Assessment & Plan:    1 Patient here for trigger point injections for myofascial pain  Consent done and on chart.  Cleaned areas with alcohol and injected using a 27 gauge 1.5 inch needle  Injected 6cc Using 1% Lidocaine with no EPI  Upper traps B/L Levators Posterior scalenes L only Middle scalenes Splenius Capitus Pectoralis Major Rhomboids B/L Infraspinatus Teres Major/minor Thoracic paraspinals B/L x2 Lumbar paraspinals- B/L and 1 extra on R Other injections-    Patient's level of pain prior was 3/10 Current level of pain after injections is- around the same-   There was no bleeding or complications.  Patient was advised to drink a lot of water on day after injections to flush system Will have increased soreness for 12-48 hours after injections.  Can use Lidocaine patches the day AFTER injections Can use theracane on day of injections in places didn't inject Can use heating pad 4-6 hours AFTER injections   2. Theracane more effective after muscles relaxed- more helpful.   3. Do exercises for another week- and if not helpful, can stop after 1 week.   4. Bring down a notch on yoga.   5. Didn't feel any different after trigger point injections.   6. Wait on Duloxetine/Lyrica until f/u or calling earlier ~ 2 weeks.   7. F/U in 6 weeks.   I spent a total of 35 minutes on visit

## 2020-06-02 NOTE — Telephone Encounter (Signed)
Received a refill request for:  Alprazolam 0.5 mg LR 07/15/19, #30, 0 rfs LOV 02/13/20 FOV none scheduled  Please review and advise.   Thanks. Dm/cma

## 2020-06-02 NOTE — Patient Instructions (Signed)
  1 Patient here for trigger point injections for myofascial pain  Consent done and on chart.  Cleaned areas with alcohol and injected using a 27 gauge 1.5 inch needle  Injected 6cc Using 1% Lidocaine with no EPI  Upper traps B/L Levators Posterior scalenes L only Middle scalenes Splenius Capitus Pectoralis Major Rhomboids B/L Infraspinatus Teres Major/minor Thoracic paraspinals B/L x2 Lumbar paraspinals- B/L and 1 extra on R Other injections-    Patient's level of pain prior was 3/10 Current level of pain after injections is- around the same-   There was no bleeding or complications.  Patient was advised to drink a lot of water on day after injections to flush system Will have increased soreness for 12-48 hours after injections.  Can use Lidocaine patches the day AFTER injections Can use theracane on day of injections in places didn't inject Can use heating pad 4-6 hours AFTER injections   2. Theracane more effective after muscles relaxed- more helpful.   3. Do exercises for another week- and if not helpful, can stop after 1 week.   4. Bring down a notch on yoga.   5. Didn't feel any different after trigger point injections.   6. Wait on Duloxetine/Lyrica until f/u or calling earlier ~ 2 weeks.   7. F/U in 6 weeks.

## 2020-06-14 ENCOUNTER — Other Ambulatory Visit: Payer: Self-pay | Admitting: Internal Medicine

## 2020-06-14 ENCOUNTER — Other Ambulatory Visit: Payer: Self-pay

## 2020-06-14 ENCOUNTER — Ambulatory Visit (AMBULATORY_SURGERY_CENTER): Payer: Self-pay

## 2020-06-14 ENCOUNTER — Encounter: Payer: Self-pay | Admitting: Internal Medicine

## 2020-06-14 VITALS — Ht 65.5 in | Wt 165.0 lb

## 2020-06-14 DIAGNOSIS — Z1211 Encounter for screening for malignant neoplasm of colon: Secondary | ICD-10-CM

## 2020-06-14 MED ORDER — NA SULFATE-K SULFATE-MG SULF 17.5-3.13-1.6 GM/177ML PO SOLN
1.0000 | Freq: Once | ORAL | 0 refills | Status: DC
Start: 2020-06-14 — End: 2020-06-14

## 2020-06-14 MED FILL — SUPREP BOWEL PREP KIT: 17.5-3.13-1 | 1 days supply | Qty: 354 | Fill #0

## 2020-06-14 NOTE — Progress Notes (Signed)
No egg or soy allergy known to patient  No issues with past sedation with any surgeries or procedures no intubation problems in the past  No FH of Malignant Hyperthermia No diet pills per patient No home 02 use per patient  No blood thinners per patient  Pt denies issues with constipation  No A fib or A flutter  EMMI video to pt or via [place] 19 guidelines implemented in PV today with Pt and RN   [facility], no coupon  Due to the COVID-19 pandemic we are asking patients to follow these guidelines. Please only bring one care partner. Please be aware that your care partner may wait in the car in the parking lot or if they feel like they will be too hot to wait in the car, they may wait in the lobby on the 4th floor. All care partners are required to wear a mask the entire time (we do not have any that we can provide them), they need to practice social distancing, and we will do a Covid check for all patient's and care partners when you arrive. Also we will check their temperature and your temperature. If the care partner waits in their car they need to stay in the parking lot the entire time and we will call them on their cell phone when the patient is ready for discharge so they can bring the car to the front of the building. Also all patient's will need to wear a mask into building.

## 2020-06-24 ENCOUNTER — Other Ambulatory Visit: Payer: Self-pay | Admitting: Physical Medicine and Rehabilitation

## 2020-06-24 MED ORDER — DULOXETINE HCL 30 MG PO CPEP: 30.0000 mg | ORAL_CAPSULE | Freq: Every day | ORAL | 5 refills | Status: DC

## 2020-06-24 MED FILL — DULoxetine HCL 30 MG CPEP: 30 | 30 days supply | Qty: 60 | Fill #0

## 2020-06-24 NOTE — Telephone Encounter (Signed)
Will try Duloxetine after discussed side effect profiles of Duloxetine/Cymbatla as compared to Lyrica/Pregabalin.      Duloxetine /Cymbalta 30 mg daily or nightly x 1 week  Then 60 mg nightly- for nerve pain  1% of patients can have nausea with Duloxetine- usually lasts no more than 7 days- call me if needs an anti-nausea medicine. Can also cause mild dry mouth/dry eyes and mild constipation. Can cause sleepiness- if so, take at night.   Sent into [facility]

## 2020-06-28 ENCOUNTER — Ambulatory Visit (AMBULATORY_SURGERY_CENTER): Payer: 59 | Admitting: Internal Medicine

## 2020-06-28 ENCOUNTER — Encounter: Payer: Self-pay | Admitting: Internal Medicine

## 2020-06-28 ENCOUNTER — Other Ambulatory Visit: Payer: Self-pay

## 2020-06-28 VITALS — BP 98/50 | HR 45 | Temp 98.6°F | Resp 15 | Ht 65.5 in | Wt 165.0 lb

## 2020-06-28 DIAGNOSIS — Z1211 Encounter for screening for malignant neoplasm of colon: Secondary | ICD-10-CM

## 2020-06-28 MED ORDER — SODIUM CHLORIDE 0.9 % IV SOLN: 500.0000 mL | Freq: Once | INTRAVENOUS | Status: DC

## 2020-06-28 NOTE — Progress Notes (Signed)
[employer]

## 2020-06-28 NOTE — Op Note (Signed)
[place] Patient Name: Kelsey Coleman Procedure Date: 06/28/2020 10:52 AM MRN: [id number] Endoscopist: Docia Chuck. Henrene Pastor MD, MD Age: 49 Referring MD:  Date of Birth: 1971/06/16 Gender: Female Account #: [id number] Procedure:                Colonoscopy Indications:              Screening for colorectal malignant neoplasm Medicines:                Monitored Anesthesia Care Procedure:                Pre-Anesthesia Assessment:                           - Prior to the procedure, a History and Physical                            was performed, and patient medications and                            allergies were reviewed. The patient's tolerance of                            previous anesthesia was also reviewed. The risks                            and benefits of the procedure and the sedation                            options and risks were discussed with the patient.                            All questions were answered, and informed consent                            was obtained. Prior Anticoagulants: The patient has                            taken no previous anticoagulant or antiplatelet                            agents. ASA Grade Assessment: II - A patient with                            mild systemic disease. After reviewing the risks                            and benefits, the patient was deemed in                            satisfactory condition to undergo the procedure.                           After obtaining informed consent, the colonoscope  was passed under direct vision. Throughout the                            procedure, the patient's blood pressure, pulse, and                            oxygen saturations were monitored continuously. The                            Colonoscope was introduced through the anus and                            advanced to the the cecum, identified by                            appendiceal orifice and  ileocecal valve. The                            ileocecal valve, appendiceal orifice, and rectum                            were photographed. The quality of the bowel                            preparation was excellent. The colonoscopy was                            performed without difficulty. The patient tolerated                            the procedure well. The bowel preparation used was                            SUPREP via split dose instruction. Scope In: 10:59:28 AM Scope Out: 11:10:49 AM Scope Withdrawal Time: 0 hours 8 minutes 25 seconds  Total Procedure Duration: 0 hours 11 minutes 21 seconds  Findings:                 The entire examined colon appeared normal on direct                            and retroflexion views. Complications:            No immediate complications. Estimated blood loss:                            None. Estimated Blood Loss:     Estimated blood loss: none. Impression:               - The entire examined colon is normal on direct and                            retroflexion views.                           - No specimens collected.  Recommendation:           - Repeat colonoscopy in 10 years for screening                            purposes.                           - Patient has a contact number available for                            emergencies. The signs and symptoms of potential                            delayed complications were discussed with the                            patient. Return to normal activities tomorrow.                            Written discharge instructions were provided to the                            patient.                           - Resume previous diet.                           - Continue present medications. Docia Chuck. Henrene Pastor MD, MD 06/28/2020 11:13:59 AM This report has been signed electronically.

## 2020-06-28 NOTE — Patient Instructions (Addendum)
No Polyps Resume previous diet Continue current medications Repeat colonoscopy in 10 years  YOU HAD AN ENDOSCOPIC PROCEDURE TODAY AT [facility]:   Refer to the procedure report that was given to you for any specific questions about what was found during the examination.  If the procedure report does not answer your questions, please call your gastroenterologist to clarify.  If you requested that your care partner not be given the details of your procedure findings, then the procedure report has been included in a sealed envelope for you to review at your convenience later.  YOU SHOULD EXPECT: Some feelings of bloating in the abdomen. Passage of more gas than usual.  Walking can help get rid of the air that was put into your GI tract during the procedure and reduce the bloating. If you had a lower endoscopy (such as a colonoscopy or flexible sigmoidoscopy) you may notice spotting of blood in your stool or on the toilet paper. If you underwent a bowel prep for your procedure, you may not have a normal bowel movement for a few days.  Please Note:  You might notice some irritation and congestion in your nose or some drainage.  This is from the oxygen used during your procedure.  There is no need for concern and it should clear up in a day or so.  SYMPTOMS TO REPORT IMMEDIATELY:   Following lower endoscopy (colonoscopy or flexible sigmoidoscopy):  Excessive amounts of blood in the stool  Significant tenderness or worsening of abdominal pains  Swelling of the abdomen that is new, acute  Fever of 100F or higher  For urgent or emergent issues, a gastroenterologist can be reached at any hour by calling [phone]. Do not use MyChart messaging for urgent concerns.   DIET:  We do recommend a small meal at first, but then you may proceed to your regular diet.  Drink plenty of fluids but you should avoid alcoholic beverages for 24 hours.  ACTIVITY:  You should plan to take it easy  for the rest of today and you should NOT DRIVE or use heavy machinery until tomorrow (because of the sedation medicines used during the test).    FOLLOW UP: Our staff will call the number listed on your records 48-72 hours following your procedure to check on you and address any questions or concerns that you may have regarding the information given to you following your procedure. If we do not reach you, we will leave a message.  We will attempt to reach you two times.  During this call, we will ask if you have developed any symptoms of COVID 19. If you develop any symptoms (ie: fever, flu-like symptoms, shortness of breath, cough etc.) before then, please call [phone].  If you test positive for Covid 19 in the 2 weeks post procedure, please call and report this information to [place].    If any biopsies were taken you will be contacted by phone or by letter within the next 1-3 weeks.  Please call us at [phone] if you have not heard about the biopsies in 3 weeks.    SIGNATURES/CONFIDENTIALITY: You and/or your care partner have signed paperwork which will be entered into your electronic medical record.  These signatures attest to the fact that that the information above on your After Visit Summary has been reviewed and is understood.  Full responsibility of the confidentiality of this discharge information lies with you and/or your care-partner.

## 2020-06-28 NOTE — Progress Notes (Signed)
pt tolerated well. VSS. awake and to recovery. Report given to RN.  

## 2020-06-30 ENCOUNTER — Telehealth: Payer: Self-pay | Admitting: *Deleted

## 2020-06-30 ENCOUNTER — Telehealth: Payer: Self-pay

## 2020-06-30 NOTE — Telephone Encounter (Signed)
  Follow up Call-  Call back number 06/28/2020  Post procedure Call Back phone  # [phone]  Permission to leave phone message Yes  Some recent data might be hidden     Patient questions:  Do you have a fever, pain , or abdominal swelling? No. Pain Score  0 *  Have you tolerated food without any problems? Yes.    Have you been able to return to your normal activities? Yes.    Do you have any questions about your discharge instructions: Diet   No. Medications  No. Follow up visit  No.  Do you have questions or concerns about your Care? No.  Actions: * If pain score is 4 or above: No action needed, pain <4.  1. Have you developed a fever since your procedure? no  2.   Have you had an respiratory symptoms (SOB or cough) since your procedure? no  3.   Have you tested positive for COVID 19 since your procedure no  4.   Have you had any family members/close contacts diagnosed with the COVID 19 since your procedure?  no   If yes to any of these questions please route to Joylene John, RN and Joella Prince, RN

## 2020-06-30 NOTE — Telephone Encounter (Signed)
Left voicemail for followup.

## 2020-07-05 MED FILL — LEVOTHYROXINE 75 MCG TABLET: 75 | 90 days supply | Qty: 90 | Fill #1

## 2020-07-12 DIAGNOSIS — R8290 Unspecified abnormal findings in urine: Secondary | ICD-10-CM | POA: Diagnosis not present

## 2020-07-12 DIAGNOSIS — Z9189 Other specified personal risk factors, not elsewhere classified: Secondary | ICD-10-CM | POA: Diagnosis not present

## 2020-07-12 DIAGNOSIS — Z309 Encounter for contraceptive management, unspecified: Secondary | ICD-10-CM | POA: Diagnosis not present

## 2020-07-12 DIAGNOSIS — Z01419 Encounter for gynecological examination (general) (routine) without abnormal findings: Secondary | ICD-10-CM | POA: Diagnosis not present

## 2020-07-12 DIAGNOSIS — Z6827 Body mass index (BMI) 27.0-27.9, adult: Secondary | ICD-10-CM | POA: Diagnosis not present

## 2020-07-12 DIAGNOSIS — Z1231 Encounter for screening mammogram for malignant neoplasm of breast: Secondary | ICD-10-CM | POA: Diagnosis not present

## 2020-07-16 ENCOUNTER — Ambulatory Visit: Payer: 59 | Admitting: Physical Medicine and Rehabilitation

## 2020-07-30 ENCOUNTER — Encounter: Payer: 59 | Attending: Physical Medicine and Rehabilitation | Admitting: Physical Medicine and Rehabilitation

## 2020-07-30 ENCOUNTER — Encounter: Payer: Self-pay | Admitting: Physical Medicine and Rehabilitation

## 2020-07-30 ENCOUNTER — Other Ambulatory Visit: Payer: Self-pay

## 2020-07-30 VITALS — BP 125/79 | HR 77 | Temp 98.6°F | Ht 65.5 in | Wt 166.0 lb

## 2020-07-30 DIAGNOSIS — G8929 Other chronic pain: Secondary | ICD-10-CM | POA: Diagnosis not present

## 2020-07-30 DIAGNOSIS — M545 Low back pain, unspecified: Secondary | ICD-10-CM | POA: Insufficient documentation

## 2020-07-30 DIAGNOSIS — M7918 Myalgia, other site: Secondary | ICD-10-CM | POA: Diagnosis not present

## 2020-07-30 NOTE — Progress Notes (Signed)
Pt is a 49 yr old female with hx of fibroids s/p ablation, Hyper methianemia  and IBS, hypothyroidism, and Anxiety with remote rx for Xanax (11/20) Here for f/u of low back pain without sciatica.   Didn't have response to trigger point injections. Got a little sore that day but no IMPROVEMENT in Sx's.   Taking Cymbalta for 3 weeks now.  Hasn't noticed any difference so far.   A little more pain on L side of back  Hasn't been doing anything at all- hasn't been doing yoga, walking or outside work lately.  Nothing to set it off.  Had a massage- since last seen.  Has more pain for 3-4 days-   Has never tried lidocaine patches over the counter/Rx.     Exam: Awake, alert, appropriate- sitting on table, NAD TTP over R low back when twists only- appears slightly swollen compared to L low back  Plan: 1. Myofascial release- needs to do for massage- NOT swedish massage. If call to schedule a new one, ask them if can do Myofascial release massage.     2. Lidocaine patches 4%- 1-3 patches- 12 hr son;12 hrs off- if doesn't help AT ALL, not sure massage will be helpful.    3. Wait for another 3 weeks to see if Duloxetine kicks in- if doesn't,    4. Discussed low dose naltrexone - (LDN) use when pts aren't responding to traditional medications.    5. Went  Over side effects risk of Cymbalta and Lyrica.  Will use Lyrica next if Cymbalta doesn't help.    6. F/U in 6-8 weeks. Wait another 3 weeks- then give me a call to see if need to change meds.     I spent a total of 25 minutes on visit- as detailed.

## 2020-07-30 NOTE — Patient Instructions (Signed)
Plan: 1. Myofascial release- needs to do for massage- NOT swed[place] massage. If call to schedule a new one, ask them if can do Myofascial release massage.     2. Lidocaine patches 4%- 1-3 patches- 12 hr son;12 hrs off- if doesn't help AT ALL, not sure massage will be helpful.    3. Wait for another 3 weeks to see if Duloxetine kicks in- if doesn't,    4. Discussed low dose naltrexone - (LDN) use when pts aren't responding to traditional medications.    5. Went  Over side effects risk of Cymbalta and Lyrica.  Will use Lyrica next if Cymbalta doesn't help.    6. F/U in 6-8 weeks. Wait another 3 weeks- then give me a call to see if need to change meds.

## 2020-08-26 ENCOUNTER — Encounter: Payer: Self-pay | Admitting: Family Medicine

## 2020-08-26 MED FILL — DULoxetine HCL 30 MG CPEP: 30 | 30 days supply | Qty: 60 | Fill #1

## 2020-08-26 NOTE — Telephone Encounter (Signed)
Patient wants to get a refill on his Albuterol inhaler that wasn't given by you.   Please advise.   Thanks.   Dm/cma

## 2020-08-28 ENCOUNTER — Other Ambulatory Visit: Payer: Self-pay | Admitting: Family

## 2020-08-28 MED ORDER — ALBUTEROL SULFATE HFA 108 (90 BASE) MCG/ACT IN AERS
INHALATION_SPRAY | RESPIRATORY_TRACT | 1 refills | Status: DC
Start: 2020-08-28 — End: 2020-08-28

## 2020-08-30 MED FILL — ALBUTEROL SULFATE HFA 108 (: 108 (90 BAS | 16 days supply | Qty: 18 | Fill #0

## 2020-09-06 ENCOUNTER — Other Ambulatory Visit: Payer: Self-pay | Admitting: Physical Medicine and Rehabilitation

## 2020-09-06 MED ORDER — PREGABALIN 50 MG PO CAPS: 50.0000 mg | ORAL_CAPSULE | Freq: Two times a day (BID) | ORAL | 5 refills | Status: DC

## 2020-09-06 MED FILL — PREGABALIN 50 MG CAPS: 50 | 30 days supply | Qty: 120 | Fill #0

## 2020-09-06 NOTE — Telephone Encounter (Signed)
Decided to try Lyrica 50 mg BID x 1 week, then 100 mg BID- for back pain.

## 2020-09-24 ENCOUNTER — Encounter: Payer: 59 | Attending: Physical Medicine and Rehabilitation | Admitting: Physical Medicine and Rehabilitation

## 2020-09-24 ENCOUNTER — Other Ambulatory Visit: Payer: Self-pay | Admitting: Physical Medicine and Rehabilitation

## 2020-09-24 ENCOUNTER — Other Ambulatory Visit: Payer: Self-pay

## 2020-09-24 ENCOUNTER — Encounter: Payer: Self-pay | Admitting: Physical Medicine and Rehabilitation

## 2020-09-24 VITALS — BP 126/83 | HR 69 | Temp 98.5°F | Ht 65.5 in | Wt 173.4 lb

## 2020-09-24 DIAGNOSIS — M545 Low back pain, unspecified: Secondary | ICD-10-CM

## 2020-09-24 DIAGNOSIS — E7219 Other disorders of sulfur-bearing amino-acid metabolism: Secondary | ICD-10-CM | POA: Diagnosis not present

## 2020-09-24 DIAGNOSIS — G8929 Other chronic pain: Secondary | ICD-10-CM | POA: Insufficient documentation

## 2020-09-24 MED ORDER — LEVETIRACETAM 500 MG PO TABS: 500.0000 mg | ORAL_TABLET | Freq: Two times a day (BID) | ORAL | 1 refills | Status: DC

## 2020-09-24 MED FILL — levETIRAcetam 500 MG TABS: 500 | 30 days supply | Qty: 120 | Fill #0

## 2020-09-24 NOTE — Progress Notes (Signed)
Subjective:    Patient ID: Kelsey Coleman, female    DOB: 05/11/1971, 50 y.o.   MRN: [id number]  HPI   Pt is a 50 yr old female with hx of fibroids s/p ablation, Hyper methianemia and IBS, hypothyroidism, and Anxiety with remote rx for Xanax (11/20) Here for f/u of low back pain without sciatica.    Cymbalta has helped the generalized achiness, but not the back pain.  Added Lyrica 2 weeks ago- hasn't changed the back pain so far.   Back pain is 3-4/10 at rest, by end of day, "killing her".  Leaning forward slightly makes  The pain the worst- gets up to 7-8/10.    Thinks took Tramadol in past for pain- doesn't think was helpful, but also doesn't remember dose.   Tried Lidoderm patches- didn't help at all.   Interested in epidural injections- hasn't had MRI to look at cause of back pain   Low back pain now radiates from Right low back- now radiates to the left side, but nothing down legs.  Just started going to left on last 1-2 years- (had for "years".   Aching, throbbing- usually dull ache, but can get to burning/throbbing.    Pain Inventory Average Pain 3 Pain Right Now 3 My pain is dull and aching  In the last 24 hours, has pain interfered with the following? General activity 3 Relation with others 1 Enjoyment of life 1 What TIME of day is your pain at its worst? evening and night Sleep (in general) Fair  Pain is worse with: bending, standing and some activites Pain improves with: rest and heat/ice Relief from Meds: 0  Family History  Problem Relation Age of Onset  . Cancer Mother   . Hyperlipidemia Mother   . Cancer Father   . Arthritis Father   . Hearing loss Father   . Alcohol abuse Sister   . Mental illness Sister   . Hearing loss Maternal Grandfather   . Hypertension Paternal Grandmother   . Kidney disease Paternal Grandmother   . Colon polyps Neg Hx   . Colon cancer Neg Hx   . Esophageal cancer Neg Hx   . Rectal cancer Neg Hx   . Stomach cancer  Neg Hx    Social History   Socioeconomic History  . Marital status: Married    Spouse name: Not on file  . Number of children: Not on file  . Years of education: Not on file  . Highest education level: Not on file  Occupational History  . Not on file  Tobacco Use  . Smoking status: Never Smoker  . Smokeless tobacco: Never Used  Vaping Use  . Vaping Use: Never used  Substance and Sexual Activity  . Alcohol use: Yes    Comment: less than weekly, occasional  . Drug use: Never  . Sexual activity: Not on file  Other Topics Concern  . Not on file  Social History Narrative  . Not on file   Social Determinants of Health   Financial Resource Strain: Not on file  Food Insecurity: Not on file  Transportation Needs: Not on file  Physical Activity: Not on file  Stress: Not on file  Social Connections: Not on file   Past Surgical History:  Procedure Laterality Date  . ABLATION  05/16/2019  . right ankle tendon repair    . WISDOM TOOTH EXTRACTION     Past Surgical History:  Procedure Laterality Date  . ABLATION  05/16/2019  .  right ankle tendon repair    . WISDOM TOOTH EXTRACTION     Past Medical History:  Diagnosis Date  . Allergy    dust, milk, pollen, tree nut-diarrhea,   . Anxiety   . Asthma   . Heart murmur   . IBS (irritable bowel syndrome)   . Thyroid disease    BP 126/83   Pulse 69   Temp 98.5 F (36.9 C)   Ht 5' 5.5" (1.664 m)   Wt 173 lb 6.4 oz (78.7 kg)   SpO2 94%   BMI 28.42 kg/m   Opioid Risk Score:   Fall Risk Score:  `1  Depression screen PHQ 2/9  Depression screen PHQ 2/9 04/07/2020  Decreased Interest 0  Down, Depressed, Hopeless 0  PHQ - 2 Score 0  Altered sleeping 0  Tired, decreased energy 0  Change in appetite 0  Feeling bad or failure about yourself  0  Trouble concentrating 0  Moving slowly or fidgety/restless 0  Suicidal thoughts 0  PHQ-9 Score 0    Review of Systems  Musculoskeletal: Positive for back pain.        Buttocks area  All other systems reviewed and are negative.      Objective:   Physical Exam Awake, alert, appropriate, sitting on table with using arms to support self- not necessary based on strength exam, but for pain relief, NAD Left SLR (+), but interestingly, R SLR (-) Extension with rotation worse B/L- usually hurts more to left.  Also lumbar flexion hurts somewhat more, but less than extension/rotation does.  Strength still 5/5 in LEs B/L- HF, KE, KF, DF and PF B/L Sensation intact to light touch in all LE dermatomes B/L Most TTP over L4/5 right paraspinals not midline      Assessment & Plan:   Pt is a 50 yr old female with hx of fibroids s/p ablation, Hyper methianemia and IBS, hypothyroidism, and Anxiety with remote rx for Xanax (11/20) Here for f/u of low back pain without sciatica.    1. MRI of lumbar spine- to assess cause of R low back pain- no radiculopathy Sx's- has had for years but getting progressively worse.   2. Once MRI  done, will look into scheduling an epidural steroid injection for likely R L4/5 or L5/S1 area- based on exam.   3. Will continue Lyrica for another 1 week- if doesn't work, would prefer to try the Keppra/Levicetracem over the tramadol- so will write Rx for Keppra  4. TO wean Lyrica decrease to 1 pill 2x/day x 3 days then stop it- THEN start Keppra 500 mg 2x/day x 1 week, then 1000 mg 2x/day- for low back pain.   5. Won't interact with Cymbalta/Xanax.   6. Vit B complex- over the counter- treats the side effect of irritability of Keppra which is rare.  1 vitamin/day dosing if need it.   7. Call me to let me know how things going ~ 3-4 weeks from now- to let me know how it's working.   8. F/U 8 weeks.   I spent a total of 30 minutes on visit- as detailed above.

## 2020-09-24 NOTE — Patient Instructions (Signed)
Pt is a 50 yr old female with hx of fibroids s/p ablation, Hyper methianemia and IBS, hypothyroidism, and Anxiety with remote rx for Xanax (11/20) Here for f/u of low back pain without sciatica.    1. MRI of lumbar spine- to assess cause of R low back pain- no radiculopathy Sx's- has had for years but getting progressively worse.   2. Once MRI  done, will look into scheduling an epidural steroid injection for likely R L4/5 or L5/S1 area- based on exam.   3. Will continue Lyrica for another 1 week- if doesn't work, would prefer to try the Keppra/Levicetracem over the tramadol- so will write Rx for Keppra  4. TO wean Lyrica decrease to 1 pill 2x/day x 3 days then stop it- THEN start Keppra 500 mg 2x/day x 1 week, then 1000 mg 2x/day- for low back pain.   5. Won't interact with Cymbalta/Xanax.   6. Vit B complex- over the counter- treats the side effect of irritability of Keppra which is rare.  1 vitamin/day dosing if need it.   7. Call me to let me know how things going ~ 3-4 weeks from now- to let me know how it's working.   8. F/U 8 weeks.

## 2020-09-27 MED FILL — LEVOTHYROXINE 75 MCG TABLET: 75 | 90 days supply | Qty: 90 | Fill #2

## 2020-09-27 MED FILL — DULoxetine HCL 30 MG CPEP: 30 | 30 days supply | Qty: 60 | Fill #2

## 2020-09-28 ENCOUNTER — Ambulatory Visit
Admission: RE | Admit: 2020-09-28 | Discharge: 2020-09-28 | Disposition: A | Discharge status: Home / Self Care | Payer: 59 | Source: Ambulatory Visit | Attending: Physical Medicine and Rehabilitation | Admitting: Physical Medicine and Rehabilitation

## 2020-09-28 ENCOUNTER — Other Ambulatory Visit: Payer: Self-pay

## 2020-09-28 DIAGNOSIS — M5136 Other intervertebral disc degeneration, lumbar region: Secondary | ICD-10-CM | POA: Diagnosis not present

## 2020-09-28 DIAGNOSIS — M47816 Spondylosis without myelopathy or radiculopathy, lumbar region: Secondary | ICD-10-CM | POA: Diagnosis not present

## 2020-09-28 DIAGNOSIS — M5127 Other intervertebral disc displacement, lumbosacral region: Secondary | ICD-10-CM | POA: Diagnosis not present

## 2020-09-28 DIAGNOSIS — G8929 Other chronic pain: Secondary | ICD-10-CM

## 2020-09-28 DIAGNOSIS — M545 Low back pain, unspecified: Secondary | ICD-10-CM

## 2020-09-28 NOTE — Progress Notes (Signed)
Spoke to pt about results of lumbar MRI- shows L5/S1 nerve roots contacted by disc protrusion- on LEFT, however all pt's pain is on Right- not clear if it's referred pain causing her R sided low back pain or something else- will d/w Dr Letta Pate about location, but I think an epidural steroid injection still might be helpful for pain- asked pt to call to schedule epidural, and will d/w Dr Letta Pate, hopefully today so can discuss overall plan.

## 2020-09-30 ENCOUNTER — Telehealth: Payer: Self-pay | Admitting: Physical Medicine & Rehabilitation

## 2020-09-30 ENCOUNTER — Telehealth: Payer: Self-pay

## 2020-09-30 NOTE — Telephone Encounter (Signed)
Pt called to schedule epidural steroid injection for R L4/5 or L5/S1. I called her back and went over pre-procedure form.

## 2020-09-30 NOTE — Telephone Encounter (Signed)
Error

## 2020-10-28 ENCOUNTER — Encounter: Payer: Self-pay | Admitting: Physical Medicine & Rehabilitation

## 2020-10-28 ENCOUNTER — Encounter: Payer: 59 | Attending: Physical Medicine and Rehabilitation | Admitting: Physical Medicine & Rehabilitation

## 2020-10-28 ENCOUNTER — Other Ambulatory Visit: Payer: Self-pay

## 2020-10-28 VITALS — BP 111/76 | HR 75 | Temp 98.6°F | Ht 65.5 in | Wt 173.0 lb

## 2020-10-28 DIAGNOSIS — G8929 Other chronic pain: Secondary | ICD-10-CM | POA: Diagnosis not present

## 2020-10-28 DIAGNOSIS — M545 Low back pain, unspecified: Secondary | ICD-10-CM | POA: Diagnosis not present

## 2020-10-28 DIAGNOSIS — M7918 Myalgia, other site: Secondary | ICD-10-CM | POA: Insufficient documentation

## 2020-10-28 DIAGNOSIS — R52 Pain, unspecified: Secondary | ICD-10-CM | POA: Insufficient documentation

## 2020-10-28 MED FILL — DULoxetine HCL 30 MG CPEP: 30 | 30 days supply | Qty: 60 | Fill #3

## 2020-10-28 NOTE — Progress Notes (Signed)
  PROCEDURE RECORD [facility]   Name: Kelsey Coleman DOB:1971/04/01 MRN: [id number]  Date:10/28/2020  Physician: Alysia Penna, MD    Nurse/CMA: Truman Hayward, CMA  Allergies:  Allergies  Allergen Reactions  . Casein Diarrhea  . Chamomile Diarrhea  . Honey Diarrhea  . Honey Bee Venom Itching and Swelling  . Iodinated Diagnostic Agents   . Milk-Related Compounds Other (See Comments)  . Pollen Extract Cough, Diarrhea and Itching  . Fruit Extracts Diarrhea and Itching    Consent Signed: Yes.    Is patient diabetic? No.  CBG today?   Pregnant: No. LMP: No LMP recorded. Patient has had an ablation. (age 9-55)  Anticoagulants: no Anti-inflammatory: no Antibiotics: no  Procedure: right L4-5, L5-S1 transforaminal epidural steroid injection Position: Prone Start Time: 3:32pm  End Time: 3:38pm  Fluoro Time: 55  RN/CMA Granger Chui, CMA Lee, CMA    Time 3:05pm 3:45pm    BP 111/76 121/79    Pulse 75 61    Respirations 16 16    O2 Sat 98 98    S/S 6 6    Pain Level 6/10 0/10     D/C home with husband, patient A & O X 3, D/C instructions reviewed, and sits independently.

## 2020-10-28 NOTE — Progress Notes (Signed)
RIght L5-S1Lumbosacral selective nerve block under fluoroscopic guidance with contrast enhancement  Indication: RIght Lumbosacral radiculitis is not relieved by medication management or other conservative care and interfering with self-care and mobility.Pt not on anticoagulants, MRI reviewed finding of lumbar DDD and Lumbar facet arthropathy L>R side   Informed consent was obtained after describing risk and benefits of the procedure with the patient, this includes bleeding, bruising, infection, paralysis and medication side effects.  The patient wishes to proceed and has given written consent.  Patient was placed in prone position.  The lumbar area was marked and prepped with Betadine.  It was entered with a 25-gauge 1-1/2 inch needle and one mL of 1% lidocaine was injected into the skin and subcutaneous tissue.  Then a 22-gauge 5in spinal needle was inserted into the RIght L5-S1  intervertebral foramen under AP, lateral, and oblique view.  Once needle tip was within the foramen on lateral views an dnor exceeding 6 o clock position on th epedical on AP viewed Isovue 200 was inected x 29ml Then a solution containing one mL of 10 mg per mL dexamethasone and 2 mL of 1% lidocaine was injected.  The patient tolerated procedure well.  Post procedure instructions were given.  Please see post procedure form.  Pre injection pain 6/10 Post injection pain 0/10  If no significant improvements consider RIght  Lumbar MBB vs RIght Sacroiliac injection

## 2020-10-28 NOTE — Patient Instructions (Signed)

## 2020-11-19 ENCOUNTER — Other Ambulatory Visit: Payer: Self-pay

## 2020-11-19 ENCOUNTER — Other Ambulatory Visit: Payer: Self-pay | Admitting: Physical Medicine and Rehabilitation

## 2020-11-19 ENCOUNTER — Encounter: Payer: 59 | Admitting: Physical Medicine and Rehabilitation

## 2020-11-19 ENCOUNTER — Encounter: Payer: Self-pay | Admitting: Physical Medicine and Rehabilitation

## 2020-11-19 VITALS — BP 110/75 | HR 67 | Temp 98.4°F | Ht 65.5 in | Wt 175.0 lb

## 2020-11-19 DIAGNOSIS — M7918 Myalgia, other site: Secondary | ICD-10-CM

## 2020-11-19 DIAGNOSIS — G8929 Other chronic pain: Secondary | ICD-10-CM

## 2020-11-19 DIAGNOSIS — M545 Low back pain, unspecified: Secondary | ICD-10-CM | POA: Diagnosis not present

## 2020-11-19 DIAGNOSIS — R52 Pain, unspecified: Secondary | ICD-10-CM | POA: Diagnosis not present

## 2020-11-19 MED ORDER — DULOXETINE HCL 60 MG PO CPEP: 60.0000 mg | ORAL_CAPSULE | Freq: Every day | ORAL | 5 refills | Status: DC

## 2020-11-19 MED FILL — DULoxetine HCL 60 MG CPEP: 60 | 30 days supply | Qty: 30 | Fill #0

## 2020-11-19 NOTE — Patient Instructions (Signed)
Pt is a 50 yr old female with hx of fibroids s/p ablation, Hyper methianemia and IBS, hypothyroidism, and Anxiety with remote rx for Xanax (11/20)  1. Has tried multiple medicines- we've tried Lyrica- no effect; Duloxetine helpful for generalized pain, but not back pain; and tried Keppra with no effect; and trigger point injections which were not helpful. Also tried Lidoderm with no effect;  Has failed Flexeril and Skelaxin as well. Has taken without result. Of note, has also tried [employer], Tart Cherry and Turmeric without success.  Pretty much failed L5/S1 nerve block and PT.  Has tried tramadol in past- a long time ago- didn't help- doesn't remember the dose-   2. Will try Low dose Natrexone- try 3 mg daily x 1 week, then 6 mg -  [facility]  also called in a follow up Rx 6 mg daily 30 days with 5 refills.   3. Discussed 4 options- 1. LDN 2. Tramadol or more  3. Belbuca  4. Referral to Dr Mechele Dawley, etc.   4. Con't Duloxetine- will send in 60 mg daily capsule- and same dose, but now only 1 capsule/day.    5. F/U 2 months-

## 2020-11-19 NOTE — Progress Notes (Signed)
Subjective:    Patient ID: Kelsey Coleman, female    DOB: 05/11/1971, 50 y.o.   MRN: [id number]  HPI   Pt is a 50 yr old female with hx of fibroids s/p ablation, Hyper methianemia and IBS, hypothyroidism, and Anxiety with remote rx for Xanax (11/20)  Here for f/u of back pain Got injection from Dr Letta Pate- R L5/S1 nerve block on 10/28/20.  Injection - was done with dexamethasone- only the 2nd week, noticed some improvement. Didn't have any pain when making copies for 1 hour.   Used to kill her; has been on feet a lot- stood on feet in kitchen yesterday- was worse again.    Pain is the same. No improvement again.  Has new symptom- after overdoing it.  Now has new radiation of pain down R leg- to the foot. Was walking and overdid, but was worried about it, because it was new.   Also had some R knee pain- which was also new.  New pain in neck- trying to stretch multiple times per day.  Past 2 nights, overall pain was doing better with Duloxetine.  But was worse past 2 nights.  Could feel tension and have pain the last 2 nights- was worried that pain- generalized pain, would be coming back?  Also has had more reflux lately.   Off Lyrcia AND Keppra now. Just on Duloxetine.    Pain Inventory Average Pain 5 Pain Right Now 4 My pain is burning, dull and aching  In the last 24 hours, has pain interfered with the following? General activity 5 Relation with others 0 Enjoyment of life 0 What TIME of day is your pain at its worst? morning  Sleep (in general) Fair  Pain is worse with: bending and standing Pain improves with: rest and heat/ice Relief from Meds: na  Family History  Problem Relation Age of Onset  . Cancer Mother   . Hyperlipidemia Mother   . Cancer Father   . Arthritis Father   . Hearing loss Father   . Alcohol abuse Sister   . Mental illness Sister   . Hearing loss Maternal Grandfather   . Hypertension Paternal Grandmother   . Kidney disease Paternal  Grandmother   . Colon polyps Neg Hx   . Colon cancer Neg Hx   . Esophageal cancer Neg Hx   . Rectal cancer Neg Hx   . Stomach cancer Neg Hx    Social History   Socioeconomic History  . Marital status: Married    Spouse name: Not on file  . Number of children: Not on file  . Years of education: Not on file  . Highest education level: Not on file  Occupational History  . Not on file  Tobacco Use  . Smoking status: Never Smoker  . Smokeless tobacco: Never Used  Vaping Use  . Vaping Use: Never used  Substance and Sexual Activity  . Alcohol use: Yes    Comment: less than weekly, occasional  . Drug use: Never  . Sexual activity: Not on file  Other Topics Concern  . Not on file  Social History Narrative  . Not on file   Social Determinants of Health   Financial Resource Strain: Not on file  Food Insecurity: Not on file  Transportation Needs: Not on file  Physical Activity: Not on file  Stress: Not on file  Social Connections: Not on file   Past Surgical History:  Procedure Laterality Date  . ABLATION  05/16/2019  .  right ankle tendon repair    . WISDOM TOOTH EXTRACTION     Past Surgical History:  Procedure Laterality Date  . ABLATION  05/16/2019  . right ankle tendon repair    . WISDOM TOOTH EXTRACTION     Past Medical History:  Diagnosis Date  . Allergy    dust, milk, pollen, tree nut-diarrhea,   . Anxiety   . Asthma   . Heart murmur   . IBS (irritable bowel syndrome)   . Thyroid disease    BP 110/75   Pulse 67   Temp 98.4 F (36.9 C)   Ht 5' 5.5" (1.664 m)   Wt 175 lb (79.4 kg)   SpO2 98%   BMI 28.68 kg/m   Opioid Risk Score:   Fall Risk Score:  `1  Depression screen PHQ 2/9  Depression screen [facility] 2/9 11/19/2020 04/07/2020  Decreased Interest 0 0  Down, Depressed, Hopeless 0 0  PHQ - 2 Score 0 0  Altered sleeping - 0  Tired, decreased energy - 0  Change in appetite - 0  Feeling bad or failure about yourself  - 0  Trouble concentrating - 0   Moving slowly or fidgety/restless - 0  Suicidal thoughts - 0  PHQ-9 Score - 0    Review of Systems  Musculoskeletal:       Toe cramping  All other systems reviewed and are negative.      Objective:   Physical Exam Awake, alert, appropriate, sitting on table, NAD Appears stiff, uncomfortable TTP Over R low paraspinals and midline back lower lumbar spine.         Assessment & Plan:    Pt is a 50 yr old female with hx of fibroids s/p ablation, Hyper methianemia and IBS, hypothyroidism, and Anxiety with remote rx for Xanax (11/20)  1. Has tried multiple medicines- we've tried Lyrica- no effect; Duloxetine helpful for generalized pain, but not back pain; and tried Keppra with no effect; and trigger point injections which were not helpful. Also tried Lidoderm with no effect;  Has failed Flexeril and Skelaxin as well. Has taken without result. Of note, has also tried [employer], Tart Cherry and Turmeric without success.  Pretty much failed L5/S1 nerve block and PT.  Has tried tramadol in past- a long time ago- didn't help- doesn't remember the dose-   2. Will try Low dose Natrexone- try 3 mg daily x 1 week, then 6 mg -  [place]  also called in a follow up Rx 6 mg daily 30 days with 5 refills. Called in LDN  3. Discussed 4 options- 1. LDN 2. Tramadol or more  3. Belbuca  4. Referral to Dr Mechele Dawley, etc. Discussed at length.   4. Con't Duloxetine- will send in 60 mg daily capsule- and same dose, but now only 1 capsule/day.    5. F/U 2 months-   I spent a total of 25 minutes on visit- as detailed above.

## 2020-12-18 MED FILL — Duloxetine HCl Enteric Coated Pellets Cap 60 MG (Base Eq): ORAL | 30 days supply | Qty: 30 | Fill #0 | Status: AC

## 2020-12-20 ENCOUNTER — Other Ambulatory Visit (HOSPITAL_COMMUNITY): Payer: Self-pay

## 2021-01-06 ENCOUNTER — Other Ambulatory Visit (HOSPITAL_COMMUNITY): Payer: Self-pay

## 2021-01-06 MED FILL — Levothyroxine Sodium Tab 75 MCG: ORAL | 90 days supply | Qty: 90 | Fill #0 | Status: AC

## 2021-01-17 ENCOUNTER — Other Ambulatory Visit (HOSPITAL_COMMUNITY): Payer: Self-pay

## 2021-01-17 MED FILL — Duloxetine HCl Enteric Coated Pellets Cap 60 MG (Base Eq): ORAL | 30 days supply | Qty: 30 | Fill #1 | Status: AC

## 2021-01-19 ENCOUNTER — Other Ambulatory Visit (HOSPITAL_COMMUNITY): Payer: Self-pay

## 2021-01-19 ENCOUNTER — Encounter: Payer: 59 | Attending: Physical Medicine and Rehabilitation | Admitting: Physical Medicine and Rehabilitation

## 2021-01-19 ENCOUNTER — Encounter: Payer: Self-pay | Admitting: Physical Medicine and Rehabilitation

## 2021-01-19 ENCOUNTER — Other Ambulatory Visit: Payer: Self-pay

## 2021-01-19 VITALS — BP 110/76 | HR 74 | Temp 98.1°F | Ht 65.5 in | Wt 179.2 lb

## 2021-01-19 DIAGNOSIS — M545 Low back pain, unspecified: Secondary | ICD-10-CM | POA: Insufficient documentation

## 2021-01-19 DIAGNOSIS — G8929 Other chronic pain: Secondary | ICD-10-CM | POA: Diagnosis not present

## 2021-01-19 DIAGNOSIS — R52 Pain, unspecified: Secondary | ICD-10-CM | POA: Insufficient documentation

## 2021-01-19 DIAGNOSIS — M7918 Myalgia, other site: Secondary | ICD-10-CM | POA: Insufficient documentation

## 2021-01-19 MED ORDER — CYCLOBENZAPRINE HCL 5 MG PO TABS
5.0000 mg | ORAL_TABLET | Freq: Three times a day (TID) | ORAL | 5 refills | Status: DC | PRN
  Filled 2021-01-19: qty 60, 20d supply, fill #0
  Filled 2021-12-23: qty 60, 20d supply, fill #1

## 2021-01-19 NOTE — Progress Notes (Signed)
Subjective:    Patient ID: Kelsey Coleman, female    DOB: 21-Jan-1971, 50 y.o.   MRN: [id number]  HPI    Pt is a 50 yr old female with hx of fibroids s/p ablation, Hyper methianemia and IBS, hypothyroidism, and Anxiety with remote rx for Xanax (11/20) Here for f/u for chronic pain/back pain    Doing activities around the house- still has pain, but has al ot more endurance.  Painted the house all day Saturday and Sunday.  Painted literally for hours.  Didn't stop due to pain- was for lunch/bathroom break, etc.   On 6 mg Low dose Naltrexone- takes it at night, but can make her sleepy.  Sleeping wonderful since started at night- along with Cymbalta.   Feels like doing well - occ breakthrough body aches- not sur eif missed some doses.  Hasn't taken Mg or Vit D in a couple days.   Still hasn't affected the one spot on R low back.  Can do more, but that pain is still constant- rates ~ 2/10.         Pain Inventory Average Pain 2 Pain Right Now 2 My pain is constant, dull and aching  In the last 24 hours, has pain interfered with the following? General activity 2 Relation with others 2 Enjoyment of life 2 What TIME of day is your pain at its worst? evening Sleep (in general) Good  Pain is worse with: bending, standing and some activites Pain improves with: rest and heat/ice Relief from Meds: 6  Family History  Problem Relation Age of Onset  . Cancer Mother   . Hyperlipidemia Mother   . Cancer Father   . Arthritis Father   . Hearing loss Father   . Alcohol abuse Sister   . Mental illness Sister   . Hearing loss Maternal Grandfather   . Hypertension Paternal Grandmother   . Kidney disease Paternal Grandmother   . Colon polyps Neg Hx   . Colon cancer Neg Hx   . Esophageal cancer Neg Hx   . Rectal cancer Neg Hx   . Stomach cancer Neg Hx    Social History   Socioeconomic History  . Marital status: Married    Spouse name: Not on file  . Number of children: Not  on file  . Years of education: Not on file  . Highest education level: Not on file  Occupational History  . Not on file  Tobacco Use  . Smoking status: Never Smoker  . Smokeless tobacco: Never Used  Vaping Use  . Vaping Use: Never used  Substance and Sexual Activity  . Alcohol use: Yes    Comment: less than weekly, occasional  . Drug use: Never  . Sexual activity: Not on file  Other Topics Concern  . Not on file  Social History Narrative  . Not on file   Social Determinants of Health   Financial Resource Strain: Not on file  Food Insecurity: Not on file  Transportation Needs: Not on file  Physical Activity: Not on file  Stress: Not on file  Social Connections: Not on file   Past Surgical History:  Procedure Laterality Date  . ABLATION  05/16/2019  . right ankle tendon repair    . WISDOM TOOTH EXTRACTION     Past Surgical History:  Procedure Laterality Date  . ABLATION  05/16/2019  . right ankle tendon repair    . WISDOM TOOTH EXTRACTION     Past Medical History:  Diagnosis  Date  . Allergy    dust, milk, pollen, tree nut-diarrhea,   . Anxiety   . Asthma   . Heart murmur   . IBS (irritable bowel syndrome)   . Thyroid disease    BP 110/76   Pulse 74   Temp 98.1 F (36.7 C)   Ht 5' 5.5" (1.664 m)   Wt 179 lb 3.2 oz (81.3 kg)   SpO2 98%   BMI 29.37 kg/m   Opioid Risk Score:   Fall Risk Score:  `1  Depression screen PHQ 2/9  Depression screen [facility] 2/9 11/19/2020 04/07/2020  Decreased Interest 0 0  Down, Depressed, Hopeless 0 0  PHQ - 2 Score 0 0  Altered sleeping - 0  Tired, decreased energy - 0  Change in appetite - 0  Feeling bad or failure about yourself  - 0  Trouble concentrating - 0  Moving slowly or fidgety/restless - 0  Suicidal thoughts - 0  PHQ-9 Score - 0   Review of Systems  Musculoskeletal:       Rt buttock pain  All other systems reviewed and are negative.      Objective:   Physical Exam Awake, alert, appropriate, sitting on  table, appears more comfortable, NAD Myofascial points/trigger points felt R>L uupper traps, scalenes, and pecs as well as levators R bicipital TTP over anterior shoulder       Assessment & Plan:    Pt is a 50 yr old female with hx of fibroids s/p ablation, Hyper methianemia and IBS, hypothyroidism, and Anxiety with remote rx for Xanax (11/20) Here for f/u for chronic pain/back pain  Also having neck pain and R biceps tendinitis  1. Ibuprofen 600 -800 mg 3x/day x10-14 days- should get it to calm down- voltaren gel- using 4x/dayon area that's bothering her will be helpful.   2. Suggest using theracane 2-3x/week- to help PREVENT the pain form getting worse.      3. Con't yoga for arthritis as well as myofascial/muscle pain- - don't lift weights with that extremity when a particular muscle group acting up.   4. Con't low dose naltrexone 6 mg daily- has 2-3 refills at this point- call me 2 weeks before need refill.   5. If needs trigger point injections- suggest calling 1 week prior- so can make sure ready to do at next appointment.    6. Medical massage or myofascial release. Don't use Swedish massage  7. Con't Duloxetine- has refills.    8. F/U in 3 months  9. Flexeril 5 mg TID prn. For muscle spasms- off Skelaxin-   I spent a total of 30 minutes on visit- discussing med mgmt as well as trigger point /myofascial work and injections and pain control.

## 2021-01-19 NOTE — Patient Instructions (Addendum)
Pt is a 50 yr old female with hx of fibroids s/p ablation, Hyper methianemia and IBS, hypothyroidism, and Anxiety with remote rx for Xanax (11/20) Here for f/u for chronic pain/back pain  Also having neck pain and R biceps tendinitis  1. Ibuprofen 600 -800 mg 3x/day x10-14 days- should get it to calm down- voltaren gel- using 4x/dayon area that's bothering her will be helpful.   2. Suggest using theracane 2-3x/week- to help PREVENT the pain form getting worse.      3. Con't yoga for arthritis as well as myofascial/muscle pain- - don't lift weights with that extremity when a particular muscle group acting up.   4. Con't low dose naltrexone 6 mg daily- has 2-3 refills at this point- call me 2 weeks before need refill.   5. If needs trigger point injections- suggest calling 1 week prior- so can make sure ready to do at next appointment.    6. Medical massage or myofascial release. Don't use swedish massage  7. Con't Duloxetine- has refills.   8. F/U in 3 months  9.  Flexeril 5 mg 3x per day AS NEEDED for muscle spasms

## 2021-02-14 ENCOUNTER — Other Ambulatory Visit (HOSPITAL_COMMUNITY): Payer: Self-pay

## 2021-02-14 MED FILL — Duloxetine HCl Enteric Coated Pellets Cap 60 MG (Base Eq): ORAL | 30 days supply | Qty: 30 | Fill #2 | Status: AC

## 2021-02-22 ENCOUNTER — Other Ambulatory Visit: Payer: Self-pay

## 2021-02-23 ENCOUNTER — Other Ambulatory Visit (HOSPITAL_COMMUNITY): Payer: Self-pay

## 2021-02-23 ENCOUNTER — Ambulatory Visit (INDEPENDENT_AMBULATORY_CARE_PROVIDER_SITE_OTHER): Payer: 59 | Admitting: Family Medicine

## 2021-02-23 ENCOUNTER — Encounter: Payer: Self-pay | Admitting: Family Medicine

## 2021-02-23 VITALS — BP 112/72 | HR 64 | Temp 98.3°F | Ht 65.0 in | Wt 180.8 lb

## 2021-02-23 DIAGNOSIS — Z13220 Encounter for screening for lipoid disorders: Secondary | ICD-10-CM | POA: Diagnosis not present

## 2021-02-23 DIAGNOSIS — E039 Hypothyroidism, unspecified: Secondary | ICD-10-CM

## 2021-02-23 DIAGNOSIS — E559 Vitamin D deficiency, unspecified: Secondary | ICD-10-CM

## 2021-02-23 DIAGNOSIS — Z0000 Encounter for general adult medical examination without abnormal findings: Secondary | ICD-10-CM

## 2021-02-23 LAB — BASIC METABOLIC PANEL
BUN: 19 mg/dL (ref 6–23)
CO2: 27 mEq/L (ref 19–32)
Calcium: 8.7 mg/dL (ref 8.4–10.5)
Chloride: 104 mEq/L (ref 96–112)
Creatinine, Ser: 0.84 mg/dL (ref 0.40–1.20)
GFR: 81.81 mL/min (ref >60.00)
Glucose, Bld: 90 mg/dL (ref 70–99)
Potassium: 4.3 mEq/L (ref 3.5–5.1)
Sodium: 138 mEq/L (ref 135–145)

## 2021-02-23 LAB — CBC
HCT: 38.2 % (ref 36.0–46.0)
Hemoglobin: 12.9 g/dL (ref 12.0–15.0)
MCHC: 33.6 g/dL (ref 30.0–36.0)
MCV: 93.9 fl (ref 78.0–100.0)
Platelets: 229 10*3/uL (ref 150.0–400.0)
RBC: 4.07 Mil/uL (ref 3.87–5.11)
RDW: 13.1 % (ref 11.5–15.5)
WBC: 7 10*3/uL (ref 4.0–10.5)

## 2021-02-23 LAB — LIPID PANEL
Cholesterol: 225 mg/dL — ABNORMAL HIGH (ref 0–200)
HDL: 43.8 mg/dL (ref >39.00)
LDL Cholesterol: 158 mg/dL — ABNORMAL HIGH (ref 0–99)
NonHDL: 181.51
Total CHOL/HDL Ratio: 5
Triglycerides: 116 mg/dL (ref 0.0–149.0)
VLDL: 23.2 mg/dL (ref 0.0–40.0)

## 2021-02-23 LAB — VITAMIN D 25 HYDROXY (VIT D DEFICIENCY, FRACTURES): VITD: 71.79 ng/mL (ref 30.00–100.00)

## 2021-02-23 LAB — T4, FREE: Free T4: 0.8 ng/dL (ref 0.60–1.60)

## 2021-02-23 LAB — TSH: TSH: 0.45 u[IU]/mL (ref 0.35–4.50)

## 2021-02-23 LAB — ALT: ALT: 15 U/L (ref 0–35)

## 2021-02-23 LAB — AST: AST: 21 U/L (ref 0–37)

## 2021-02-23 MED ORDER — VITAMIN D3 25 MCG (1000 UNIT) PO TABS: 7000.0000 [IU] | ORAL_TABLET | Freq: Every day | ORAL | 3 refills | Status: AC

## 2021-02-23 MED ORDER — LEVOTHYROXINE SODIUM 75 MCG PO TABS
75.0000 ug | ORAL_TABLET | Freq: Every day | ORAL | 3 refills | Status: DC
  Filled 2021-02-23: qty 90, fill #0
  Filled 2021-04-02: qty 90, 90d supply, fill #0
  Filled 2021-07-07: qty 90, 90d supply, fill #1
  Filled 2021-10-04: qty 90, 90d supply, fill #2
  Filled 2022-01-02: qty 90, 90d supply, fill #3

## 2021-02-23 NOTE — Progress Notes (Signed)
Kelsey Coleman is a 50 y.o. female  Chief Complaint  Patient presents with   Annual Exam    Physical-No breast or pap exam needed. Pt is fasting.     HPI: Kelsey Coleman is a 50 y.o. female patient seen today for annual CPE, fasting labs.   Last PAP: UTD - follows with [facility] Dr. Royston Sinner Last mammo: UTD  Last colonoscopy: 06/2020 - Dr. Henrene Pastor - due in 06/2030  Dental: UTD Vision: UTD  Exercise: walks few times per week Diet: minimal sugar, dairy allergy  Med refills needed today? Thyroid med   Past Medical History:  Diagnosis Date   Allergy    dust, milk, pollen, tree nut-diarrhea,    Anxiety    Asthma    Heart murmur    IBS (irritable bowel syndrome)    Thyroid disease     Past Surgical History:  Procedure Laterality Date   ABLATION  05/16/2019   right ankle tendon repair     WISDOM TOOTH EXTRACTION      Social History   Socioeconomic History   Marital status: Married    Spouse name: Not on file   Number of children: Not on file   Years of education: Not on file   Highest education level: Not on file  Occupational History   Not on file  Tobacco Use   Smoking status: Never   Smokeless tobacco: Never  Vaping Use   Vaping Use: Never used  Substance and Sexual Activity   Alcohol use: Yes    Comment: less than weekly, occasional   Drug use: Never   Sexual activity: Not on file  Other Topics Concern   Not on file  Social History Narrative   Not on file   Social Determinants of Health   Financial Resource Strain: Not on file  Food Insecurity: Not on file  Transportation Needs: Not on file  Physical Activity: Not on file  Stress: Not on file  Social Connections: Not on file  Intimate Partner Violence: Not on file    Family History  Problem Relation Age of Onset   Stroke Mother    Cancer Mother    Hyperlipidemia Mother    Cancer Father    Arthritis Father    Hearing loss Father    Alcohol abuse Sister    Mental illness Sister     Hearing loss Maternal Grandfather    Hypertension Paternal Grandmother    Kidney disease Paternal Grandmother    Colon polyps Neg Hx    Colon cancer Neg Hx    Esophageal cancer Neg Hx    Rectal cancer Neg Hx    Stomach cancer Neg Hx      Immunization History  Administered Date(s) Administered   Influenza-Unspecified 05/30/2018, 05/23/2019, 05/30/2020, 06/07/2020   PFIZER(Purple Top)SARS-COV-2 Vaccination 10/17/2019, 11/07/2019, 07/30/2020   Pneumococcal Polysaccharide-23 02/09/2017   Pneumococcal-Unspecified 02/09/2017   Tdap 08/12/2013    Outpatient Encounter Medications as of 02/23/2021  Medication Sig Note   albuterol (VENTOLIN HFA) 108 (90 Base) MCG/ACT inhaler INHALE TWO PUFFS BY MOUTH INTO THE LUNGS EVERY 4-6 HOURS IF NEEDED FOR COUGH OR WHEEZE.    ALPRAZolam (XANAX) 0.5 MG tablet 1 tablet    Calcium 500-100 MG-UNIT CHEW Calcium 500    cholecalciferol (VITAMIN D) 25 MCG (1000 UT) tablet Take 1,000 Units by mouth daily. Pt takes 7500u    Cholecalciferol (VITAMIN D3) 50 MCG (2000 UT) CAPS 1 capsule    cyclobenzaprine (FLEXERIL) 5 MG  tablet Take 1 tablet (5 mg total) by mouth 3 (three) times daily as needed for muscle spasms.    dicyclomine (BENTYL) 10 MG/ML injection 1 ml    DULoxetine (CYMBALTA) 60 MG capsule TAKE 1 CAPSULE (60 MG TOTAL) BY MOUTH DAILY. FOR NERVE PAIN    levothyroxine (SYNTHROID) 75 MCG tablet TAKE 1 TABLET (75 MCG TOTAL) BY MOUTH DAILY.    loratadine (CLARITIN) 10 MG tablet Take 10 mg by mouth daily.    Magnesium 300 MG CAPS Take by mouth.    [DISCONTINUED] dicyclomine (BENTYL) 10 MG capsule Take 10 mg by mouth as needed for spasms.    [DISCONTINUED] levETIRAcetam (KEPPRA) 500 MG tablet Take 1 tablet (500 mg total) by mouth 2 (two) times daily. X 1 week, then 1000 mg 2x/day for low back pain    [DISCONTINUED] levothyroxine (SYNTHROID) 75 MCG tablet TAKE 1 TABLET (75 MCG TOTAL) BY MOUTH DAILY.    [DISCONTINUED] metaxalone (SKELAXIN) 800 MG tablet Take 1  tablet (800 mg total) by mouth 2 (two) times daily as needed for muscle spasms. (Patient not taking: Reported on 02/23/2021) 06/02/2020: Taking sparingly   [DISCONTINUED] pregabalin (LYRICA) 50 MG capsule TAKE 1 CAPSULE (50 MG TOTAL) BY 2 TIMES DAILY FOR 1 WEEK, THEN 2 CAPSULES 2 TIMES DAILY FOR BACK PAIN (Patient not taking: Reported on 02/23/2021)    [DISCONTINUED] thyroid (ARMOUR) 30 MG tablet 1 tablet on an empty stomach (Patient not taking: Reported on 02/23/2021)    No facility-administered encounter medications on file as of 02/23/2021.     ROS: Gen: no fever, chills  Skin: no rash, itching ENT: no ear pain, ear drainage, nasal congestion, rhinorrhea, sinus pressure, sore throat Eyes: no blurry vision, double vision Resp: no cough, wheeze,SOB CV: no CP, palpitations, LE edema,  GI: no heartburn, n/v/d/c, abd pain GU: no dysuria, urgency, frequency, hematuria MSK: + chronic myalgias - improved on cymbalta Neuro: no dizziness, headache, weakness, vertigo    Allergies  Allergen Reactions   Casein Diarrhea   Chamomile Diarrhea   Honey Diarrhea   Honey Bee Venom Itching and Swelling   Iodinated Diagnostic Agents    Milk-Related Compounds Other (See Comments)   Pollen Extract Cough, Diarrhea and Itching   Fruit Extracts Diarrhea and Itching    BP 112/72 (BP Location: Right Arm, Patient Position: Sitting, Cuff Size: Normal)   Pulse 64   Temp 98.3 F (36.8 C) (Temporal)   Ht 5\' 5"  (1.651 m)   Wt 180 lb 12.8 oz (82 kg)   SpO2 98%   BMI 30.09 kg/m  Wt Readings from Last 3 Encounters:  02/23/21 180 lb 12.8 oz (82 kg)  01/19/21 179 lb 3.2 oz (81.3 kg)  11/19/20 175 lb (79.4 kg)   Temp Readings from Last 3 Encounters:  02/23/21 98.3 F (36.8 C) (Temporal)  01/19/21 98.1 F (36.7 C)  11/19/20 98.4 F (36.9 C)   BP Readings from Last 3 Encounters:  02/23/21 112/72  01/19/21 110/76  11/19/20 110/75   Pulse Readings from Last 3 Encounters:  02/23/21 64  01/19/21 74   11/19/20 67     Physical Exam Constitutional:      General: She is not in acute distress.    Appearance: She is well-developed.  HENT:     Head: Normocephalic and atraumatic.     Right Ear: Tympanic membrane and ear canal normal.     Left Ear: Tympanic membrane and ear canal normal.     Nose: Nose normal.  Eyes:  Conjunctiva/sclera: Conjunctivae normal.     Pupils: Pupils are equal, round, and reactive to light.  Neck:     Thyroid: No thyromegaly.  Cardiovascular:     Rate and Rhythm: Normal rate and regular rhythm.     Heart sounds: Normal heart sounds. No murmur heard. Pulmonary:     Effort: Pulmonary effort is normal. No respiratory distress.     Breath sounds: Normal breath sounds. No wheezing or rhonchi.  Abdominal:     General: Bowel sounds are normal. There is no distension.     Palpations: Abdomen is soft. There is no mass.     Tenderness: There is no abdominal tenderness.  Musculoskeletal:     Cervical back: Neck supple.     Right lower leg: No edema.     Left lower leg: No edema.  Lymphadenopathy:     Cervical: No cervical adenopathy.  Skin:    General: Skin is warm and dry.  Neurological:     Mental Status: She is alert and oriented to person, place, and time.     Motor: No abnormal muscle tone.     Coordination: Coordination normal.  Psychiatric:        Behavior: Behavior normal.     A/P:  1. Annual physical exam - discussed importance of regular CV exercise, healthy diet, adequate sleep - mammo, PAP, CRC screening UTD - dental and vision UTD - immunizations UTD - CBC - Basic metabolic panel - AST - ALT - next CPE in 1 year  2. Avitaminosis D - taking 7,000IU daily - VITAMIN D 25 Hydroxy (Vit-D Deficiency, Fractures)  3. Hypothyroidism, unspecified type - TSH - T4, free Refill: - levothyroxine (SYNTHROID) 75 MCG tablet; Take 1 tablet (75 mcg total) by mouth daily.  Dispense: 90 tablet; Refill: 3  4. Screening for lipid disorders -  Lipid panel    This visit occurred during the SARS-CoV-2 public health emergency.  Safety protocols were in place, including screening questions prior to the visit, additional usage of staff PPE, and extensive cleaning of exam room while observing appropriate contact time as indicated for disinfecting solutions.

## 2021-03-10 ENCOUNTER — Encounter: Payer: Self-pay | Admitting: Family Medicine

## 2021-03-15 MED FILL — Duloxetine HCl Enteric Coated Pellets Cap 60 MG (Base Eq): ORAL | 30 days supply | Qty: 30 | Fill #3 | Status: AC

## 2021-03-16 ENCOUNTER — Other Ambulatory Visit (HOSPITAL_COMMUNITY): Payer: Self-pay

## 2021-04-04 ENCOUNTER — Other Ambulatory Visit (HOSPITAL_COMMUNITY): Payer: Self-pay

## 2021-04-22 ENCOUNTER — Encounter: Payer: Self-pay | Admitting: Physical Medicine and Rehabilitation

## 2021-04-22 ENCOUNTER — Other Ambulatory Visit: Payer: Self-pay

## 2021-04-22 ENCOUNTER — Encounter: Payer: 59 | Attending: Physical Medicine and Rehabilitation | Admitting: Physical Medicine and Rehabilitation

## 2021-04-22 ENCOUNTER — Other Ambulatory Visit (HOSPITAL_COMMUNITY): Payer: Self-pay

## 2021-04-22 VITALS — BP 120/81 | HR 69 | Temp 98.4°F | Ht 65.0 in | Wt 180.6 lb

## 2021-04-22 DIAGNOSIS — M545 Low back pain, unspecified: Secondary | ICD-10-CM | POA: Diagnosis not present

## 2021-04-22 DIAGNOSIS — R52 Pain, unspecified: Secondary | ICD-10-CM

## 2021-04-22 DIAGNOSIS — G8929 Other chronic pain: Secondary | ICD-10-CM

## 2021-04-22 DIAGNOSIS — M7918 Myalgia, other site: Secondary | ICD-10-CM

## 2021-04-22 MED ORDER — NALTREXONE HCL 50 MG PO TABS: 25.0000 mg | ORAL_TABLET | Freq: Every day | ORAL | 5 refills | Status: AC

## 2021-04-22 MED ORDER — DULOXETINE HCL 60 MG PO CPEP
60.0000 mg | ORAL_CAPSULE | Freq: Every day | ORAL | 3 refills | Status: DC
  Filled 2021-04-22: qty 90, 90d supply, fill #0
  Filled 2021-07-17: qty 90, 90d supply, fill #1
  Filled 2021-10-06: qty 90, 90d supply, fill #2
  Filled 2022-01-04: qty 90, 90d supply, fill #3

## 2021-04-22 NOTE — Progress Notes (Signed)
Subjective:    Patient ID: Kelsey Coleman, female    DOB: Sep 22, 1970, 50 y.o.   MRN: [id number]  HPI Pt is a 50 yr old female with hx of fibroids s/p ablation, Hyper methianemia  and IBS, hypothyroidism, and Anxiety with remote rx for Xanax (11/20)  Here for f/u for chronic pain/back pain  Also having neck pain and R biceps tendinitis  Here for f/u on Chronic pain.   Low dose naltrexone is very helpful. Still feels pain when lifting, but manageable.   A little breakthrough achiness- a couple of times per week. Not when wakes up anymore- more, later in evenings.  If gets up from being on couch- not enough to make meds changes.   Hasn't tried medical massage yet . Is doing yoga- tries 1x/week- Saturday Am and Tuesday night classes- usually T night.   R shoulder pain has resolved.   For awhile the pain was in L 3rd distal finger- better now.   Cymbalta 60 mg daily- helps her anxiety and pain.  And only taken muscle relaxer 3x since 3 months ago.  Feel also something has helped abdominal allergy Sx's Sleeping better.  Takes naltrexone and Cymbalta both at night.      Pain Inventory Average Pain 1 Pain Right Now 1 My pain is dull and aching  In the last 24 hours, has pain interfered with the following? General activity 3 Relation with others 1 Enjoyment of life 3 What TIME of day is your pain at its worst? evening Sleep (in general) Good  Pain is worse with: bending, standing, and some activites Pain improves with: rest, heat/ice, and therapy/exercise Relief from Meds: 8  Family History  Problem Relation Age of Onset   Stroke Mother    Cancer Mother    Hyperlipidemia Mother    Cancer Father    Arthritis Father    Hearing loss Father    Alcohol abuse Sister    Mental illness Sister    Hearing loss Maternal Grandfather    Hypertension Paternal Grandmother    Kidney disease Paternal Grandmother    Colon polyps Neg Hx    Colon cancer Neg Hx    Esophageal cancer  Neg Hx    Rectal cancer Neg Hx    Stomach cancer Neg Hx    Social History   Socioeconomic History   Marital status: Married    Spouse name: Not on file   Number of children: Not on file   Years of education: Not on file   Highest education level: Not on file  Occupational History   Not on file  Tobacco Use   Smoking status: Never   Smokeless tobacco: Never  Vaping Use   Vaping Use: Never used  Substance and Sexual Activity   Alcohol use: Yes    Comment: less than weekly, occasional   Drug use: Never   Sexual activity: Not on file  Other Topics Concern   Not on file  Social History Narrative   Not on file   Social Determinants of Health   Financial Resource Strain: Not on file  Food Insecurity: Not on file  Transportation Needs: Not on file  Physical Activity: Not on file  Stress: Not on file  Social Connections: Not on file   Past Surgical History:  Procedure Laterality Date   ABLATION  05/16/2019   right ankle tendon repair     WISDOM TOOTH EXTRACTION     Past Surgical History:  Procedure Laterality Date  ABLATION  05/16/2019   right ankle tendon repair     WISDOM TOOTH EXTRACTION     Past Medical History:  Diagnosis Date   Allergy    dust, milk, pollen, tree nut-diarrhea,    Anxiety    Asthma    Heart murmur    IBS (irritable bowel syndrome)    Thyroid disease    BP 120/81   Pulse 69   Temp 98.4 F (36.9 C)   Ht '5\' 5"'$  (1.651 m)   Wt 180 lb 9.6 oz (81.9 kg)   SpO2 98%   BMI 30.05 kg/m   Opioid Risk Score:   Fall Risk Score:  `1  Depression screen PHQ 2/9  Depression screen [facility] 2/9 02/23/2021 11/19/2020 04/07/2020  Decreased Interest 0 0 0  Down, Depressed, Hopeless 0 0 0  PHQ - 2 Score 0 0 0  Altered sleeping 1 - 0  Tired, decreased energy 1 - 0  Change in appetite 0 - 0  Feeling bad or failure about yourself  0 - 0  Trouble concentrating 0 - 0  Moving slowly or fidgety/restless 0 - 0  Suicidal thoughts 0 - 0  PHQ-9 Score 2 - 0   Difficult doing work/chores Not difficult at all - -    Review of Systems  Musculoskeletal:  Positive for arthralgias.       Right hip pain  All other systems reviewed and are negative.     Objective:   Physical Exam Awake alert, appropriate, dressed well; NAS No assistive device Brighter affect; less anxious Not TTP across low back today.        Assessment & Plan:   Pt is a 50 yr old female with hx of fibroids s/p ablation, Hyper methianemia  and IBS, hypothyroidism, and Anxiety with remote rx for Xanax (11/20)  Here for f/u for chronic pain/back pain  Also having neck pain and R biceps tendinitis  Here for f/u on Chronic pain. Meds should last long term- don't usually need to titrate meds.    Will refill low dose naltrexone-  6 mg daily- 3 months supply-  with 3 refills.   2. Cymbalta 60 mg daily- will refill with 90 days supply and 3 refills.   3. Flexeril 5 mg prn for muscle spasms- doesn't need a refill today.   4. F/U in 6 months- and call me if any issues.    I spent a total of 21 minutes on visit- discussing long term options and how meds will last.

## 2021-04-22 NOTE — Patient Instructions (Signed)
Pt is a 50 yr old female with hx of fibroids s/p ablation, Hyper methianemia  and IBS, hypothyroidism, and Anxiety with remote rx for Xanax (11/20)  Here for f/u for chronic pain/back pain  Also having neck pain and R biceps tendinitis  Here for f/u on Chronic pain.   Will refill low dose naltrexone-  6 mg daily- 3 months supply-  with 3 refills.   2. Cymbalta 60 mg daily- will refill with 90 days supply and 3 refills.   3. Flexeril 5 mg prn for muscle spasms- doesn't need a refill today.   4. F/U in 6 months- and call me if any issues.

## 2021-07-07 ENCOUNTER — Other Ambulatory Visit (HOSPITAL_COMMUNITY): Payer: Self-pay

## 2021-07-18 ENCOUNTER — Other Ambulatory Visit (HOSPITAL_COMMUNITY): Payer: Self-pay

## 2021-08-09 DIAGNOSIS — Z01419 Encounter for gynecological examination (general) (routine) without abnormal findings: Secondary | ICD-10-CM | POA: Diagnosis not present

## 2021-08-09 DIAGNOSIS — Z1231 Encounter for screening mammogram for malignant neoplasm of breast: Secondary | ICD-10-CM | POA: Diagnosis not present

## 2021-08-09 DIAGNOSIS — Z6830 Body mass index (BMI) 30.0-30.9, adult: Secondary | ICD-10-CM | POA: Diagnosis not present

## 2021-08-09 LAB — HM MAMMOGRAPHY

## 2021-08-11 ENCOUNTER — Other Ambulatory Visit: Payer: Self-pay | Admitting: Obstetrics and Gynecology

## 2021-08-11 DIAGNOSIS — R928 Other abnormal and inconclusive findings on diagnostic imaging of breast: Secondary | ICD-10-CM

## 2021-09-21 ENCOUNTER — Ambulatory Visit
Admission: RE | Admit: 2021-09-21 | Discharge: 2021-09-21 | Disposition: A | Discharge status: Home / Self Care | Payer: 59 | Source: Ambulatory Visit | Attending: Obstetrics and Gynecology | Admitting: Obstetrics and Gynecology

## 2021-09-21 DIAGNOSIS — R922 Inconclusive mammogram: Secondary | ICD-10-CM | POA: Diagnosis not present

## 2021-09-21 DIAGNOSIS — N6321 Unspecified lump in the left breast, upper outer quadrant: Secondary | ICD-10-CM | POA: Diagnosis not present

## 2021-09-21 DIAGNOSIS — R928 Other abnormal and inconclusive findings on diagnostic imaging of breast: Secondary | ICD-10-CM

## 2021-09-30 ENCOUNTER — Telehealth: Payer: Self-pay | Admitting: Family Medicine

## 2021-09-30 NOTE — Telephone Encounter (Signed)
Lvm to let pt know that Dr Bryan Lemma is no longer here

## 2021-10-04 ENCOUNTER — Other Ambulatory Visit (HOSPITAL_COMMUNITY): Payer: Self-pay

## 2021-10-06 ENCOUNTER — Other Ambulatory Visit (HOSPITAL_COMMUNITY): Payer: Self-pay

## 2021-10-21 ENCOUNTER — Encounter: Payer: 59 | Attending: Physical Medicine and Rehabilitation | Admitting: Physical Medicine and Rehabilitation

## 2021-10-21 ENCOUNTER — Encounter: Payer: Self-pay | Admitting: Physical Medicine and Rehabilitation

## 2021-10-21 ENCOUNTER — Other Ambulatory Visit: Payer: Self-pay

## 2021-10-21 VITALS — BP 123/78 | HR 71 | Ht 65.0 in | Wt 184.2 lb

## 2021-10-21 DIAGNOSIS — G8929 Other chronic pain: Secondary | ICD-10-CM | POA: Insufficient documentation

## 2021-10-21 DIAGNOSIS — M545 Low back pain, unspecified: Secondary | ICD-10-CM | POA: Insufficient documentation

## 2021-10-21 DIAGNOSIS — M542 Cervicalgia: Secondary | ICD-10-CM | POA: Insufficient documentation

## 2021-10-21 DIAGNOSIS — M7918 Myalgia, other site: Secondary | ICD-10-CM | POA: Insufficient documentation

## 2021-10-21 NOTE — Patient Instructions (Addendum)
Pt is a 51 yr old female with hx of fibroids s/p ablation, Hyper methianemia  and IBS, hypothyroidism, and Anxiety with remote rx for Xanax (11/20)  Here for f/u for chronic pain/back pain  Also having neck pain and R biceps tendinitis which shoulder pain has resolved Neck pain still there.   Con't Cymbalta 60 mg daily- should have 6 months left.  2. Con't Low dose Naltrexone- should have 6 months supply left.   3. Con't flexeril as needed- if needs RX, call me- when gets to 10 pills left  4. Theracane- hold pressure 2 minutes minimum on neck muscles- as detailed below OR can use massage gun with vibration.   5. L middle scalenes, L upper traps, L levators and L splenius capitus- focus on all these muscles to get a better response.  Also can lay on side of bed and have husband use fingers to massage back of head/right below the bone/occiput   6. F/U in 6 months.

## 2021-10-21 NOTE — Progress Notes (Signed)
Subjective:    Patient ID: Kelsey Coleman, female    DOB: February 24, 1971, 51 y.o.   MRN: [id number]  HPI  Pt is a 51 yr old female with hx of fibroids s/p ablation, Hyper methianemia  and IBS, hypothyroidism, and Anxiety with remote rx for Xanax (11/20)  Here for f/u for chronic pain/back pain  Also having neck pain and R biceps tendinitis   Pain doing much better- 1-2/10.  Being pretty consistent.   The only thing new is the neck pain, which  has been bothering her more- but thinks it's getting better- thinks due to a lot of long drives in car.  R shoulder pain is resolved now-    Hasn't done a whole lot this winter.   Has had a large weight gain- due to menopause.  Gained it when was walking 3-4x/week- 4 miles/at a time.  Not walking this winter much.   Going to stay where Dr Bryan Lemma is- and go to new NP that is there now.   Still taking Low dose naltrexone- meds help tremendously.  Achiness is gone.  Along with Celexa and flexeril prn.   Takes flexeril rarely- just if overdoes it- to sleep.   Has theracane- doesn't use it.  Tried medical massage- did cupping technique Has used rollers on neck, but hasn't tried the massage gun she has. Uses vibration and heat  Yoga 1x/week- helps neck pain.   Pain Inventory Average Pain 1 Pain Right Now 1 My pain is dull  In the last 24 hours, has pain interfered with the following? General activity 2 Relation with others 1 Enjoyment of life 1 What TIME of day is your pain at its worst? night Sleep (in general) Good  Pain is worse with: bending and standing Pain improves with: rest, heat/ice, and medication Relief from Meds: 8  Family History  Problem Relation Age of Onset   Stroke Mother    Cancer Mother    Hyperlipidemia Mother    Cancer Father    Arthritis Father    Hearing loss Father    Alcohol abuse Sister    Mental illness Sister    Hearing loss Maternal Grandfather    Hypertension Paternal Grandmother     Kidney disease Paternal Grandmother    Colon polyps Neg Hx    Colon cancer Neg Hx    Esophageal cancer Neg Hx    Rectal cancer Neg Hx    Stomach cancer Neg Hx    Social History   Socioeconomic History   Marital status: Married    Spouse name: Not on file   Number of children: Not on file   Years of education: Not on file   Highest education level: Not on file  Occupational History   Not on file  Tobacco Use   Smoking status: Never   Smokeless tobacco: Never  Vaping Use   Vaping Use: Never used  Substance and Sexual Activity   Alcohol use: Yes    Comment: less than weekly, occasional   Drug use: Never   Sexual activity: Not on file  Other Topics Concern   Not on file  Social History Narrative   Not on file   Social Determinants of Health   Financial Resource Strain: Not on file  Food Insecurity: Not on file  Transportation Needs: Not on file  Physical Activity: Not on file  Stress: Not on file  Social Connections: Not on file   Past Surgical History:  Procedure Laterality Date  ABLATION  05/16/2019   right ankle tendon repair     WISDOM TOOTH EXTRACTION     Past Surgical History:  Procedure Laterality Date   ABLATION  05/16/2019   right ankle tendon repair     WISDOM TOOTH EXTRACTION     Past Medical History:  Diagnosis Date   Allergy    dust, milk, pollen, tree nut-diarrhea,    Anxiety    Asthma    Heart murmur    IBS (irritable bowel syndrome)    Thyroid disease    BP 123/78    Pulse 71    Ht 5\' 5"  (1.651 m)    Wt 184 lb 3.2 oz (83.6 kg)    SpO2 96%    BMI 30.65 kg/m   Opioid Risk Score:   Fall Risk Score:  `1  Depression screen PHQ 2/9  Depression screen [facility] 2/9 10/21/2021 04/22/2021 02/23/2021 11/19/2020 04/07/2020  Decreased Interest 0 0 0 0 0  Down, Depressed, Hopeless 0 0 0 0 0  PHQ - 2 Score 0 0 0 0 0  Altered sleeping - - 1 - 0  Tired, decreased energy - - 1 - 0  Change in appetite - - 0 - 0  Feeling bad or failure about yourself  - -  0 - 0  Trouble concentrating - - 0 - 0  Moving slowly or fidgety/restless - - 0 - 0  Suicidal thoughts - - 0 - 0  PHQ-9 Score - - 2 - 0  Difficult doing work/chores - - Not difficult at all - -      Review of Systems  Musculoskeletal:  Positive for back pain and neck pain.  All other systems reviewed and are negative.     Objective:   Physical Exam Awake, alert, appropriate, NAD L neck- splenius capitus, L upper traps, L levators and L scalenes all tighter than R side.        Assessment & Plan:   Pt is a 51 yr old female with hx of fibroids s/p ablation, Hyper methianemia  and IBS, hypothyroidism, and Anxiety with remote rx for Xanax (11/20)  Here for f/u for chronic pain/back pain  Also having neck pain and R biceps tendinitis which shoulder pain has resolved Neck pain still there.   Con't Cymbalta 60 mg daily- should have 6 months left.  2. Con't Low dose Naltrexone- should have 6 months supply left.   3. Con't flexeril as needed- if needs RX, call me- when gets to 10 pills left  4. Theracane- hold pressure 2 minutes minimum on neck muscles- as detailed below OR can use massage gun with vibration.   5. L middle scalenes, L upper traps, L levators and L splenius capitus- focus on all these muscles to get a better response.   6. F/U in 6 months.   I spent a total of  20  minutes on total care today- >50% coordination of care- due to d/w and education/demonstration of myofascial trigger points.

## 2021-12-23 ENCOUNTER — Other Ambulatory Visit (HOSPITAL_COMMUNITY): Payer: Self-pay

## 2022-01-03 ENCOUNTER — Other Ambulatory Visit (HOSPITAL_COMMUNITY): Payer: Self-pay

## 2022-01-04 IMAGING — MR MR LUMBAR SPINE W/O CM
4 of 5 series · 26 of 48 positions shown · non-contrast
Comparison: Radiographs of the lumbar spine 02/25/2019.

CLINICAL DATA: Chronic right-sided low back pain recurrent
sciatica. Low back pain, greater than 6 weeks; patient reports right
low back pain without radiculopathy the for years. Additional
history provided by scanning technologist: Patient reports chronic
low back pain for 6-7 years.

EXAM:
MRI LUMBAR SPINE WITHOUT CONTRAST
TECHNIQUE: Multiplanar, multisequence MR imaging of the lumbar spine was
performed. No intravenous contrast was administered.

[Series 2: T2 · sagittal · 4.0mm · 1.09mm/px · 6 of 17 slices shown (1 of 2)]
[im 1/17]
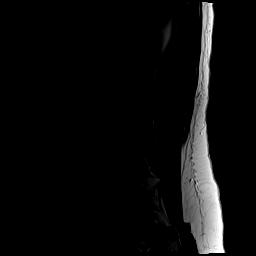
[im 4/17]
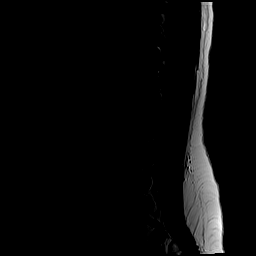
[im 7/17]
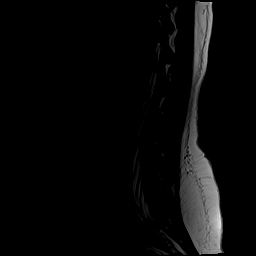
[im 10/17]
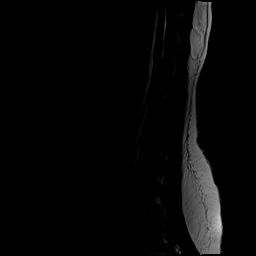
[im 13/17]
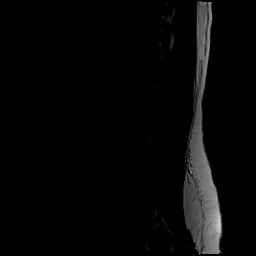
[im 17/17]
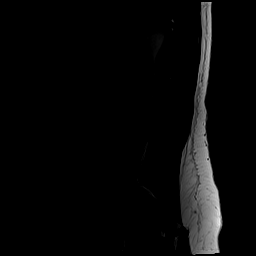

[Series 4: T1 · sagittal · 4.0mm · 1.09mm/px · 6 of 17 slices shown (1 of 2)]
[im 1/17]
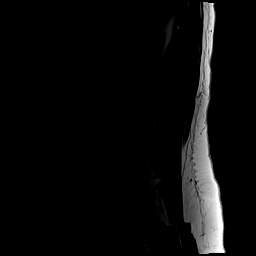
[im 4/17]
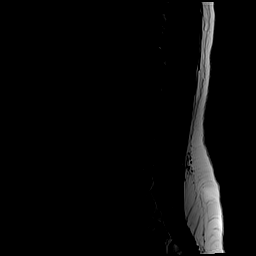
[im 7/17]
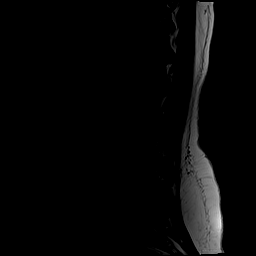
[im 10/17]
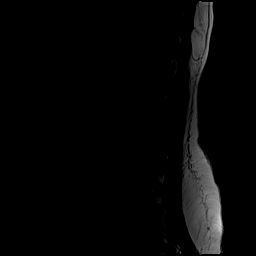
[im 13/17]
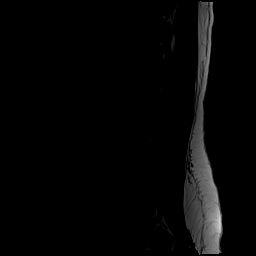
[im 17/17]
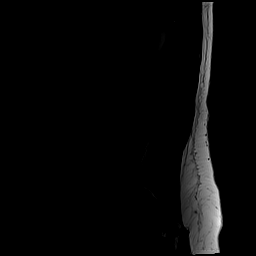

[Series 5: T2 · axial · 4.0mm · 0.39mm/px · z∈[-93,+122]mm · 9 of 42 slices shown (2 of 2)]
[im 1/42]
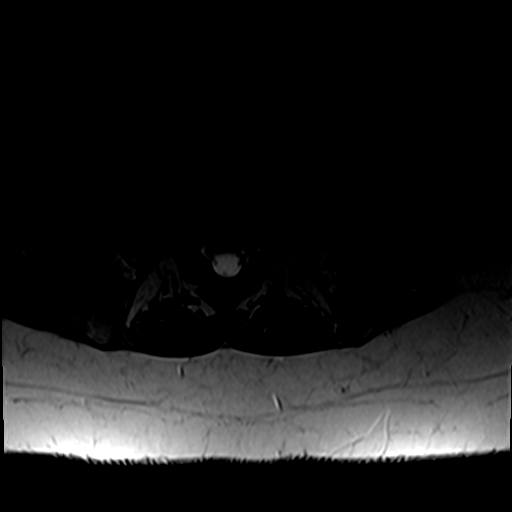
[im 6/42]
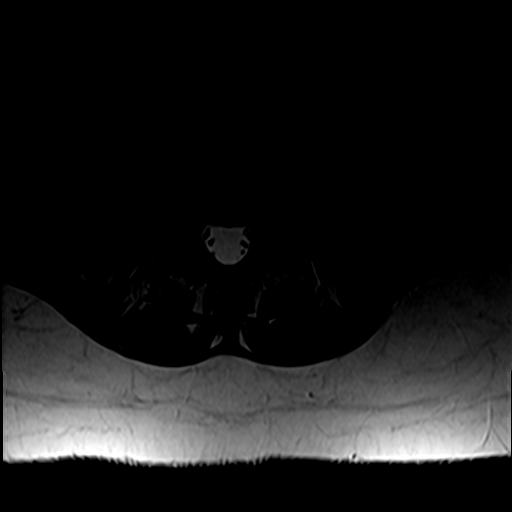
[im 12/42]
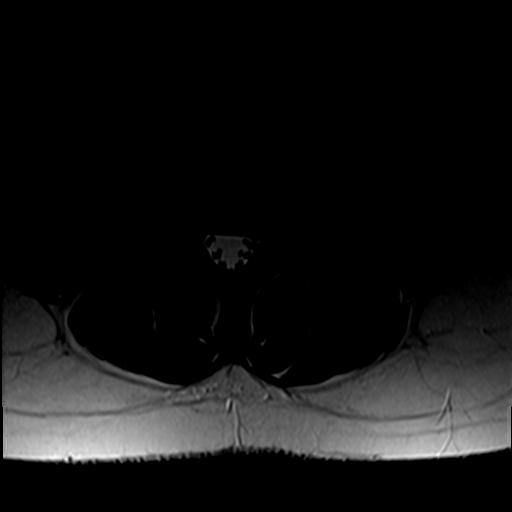
[im 18/42]
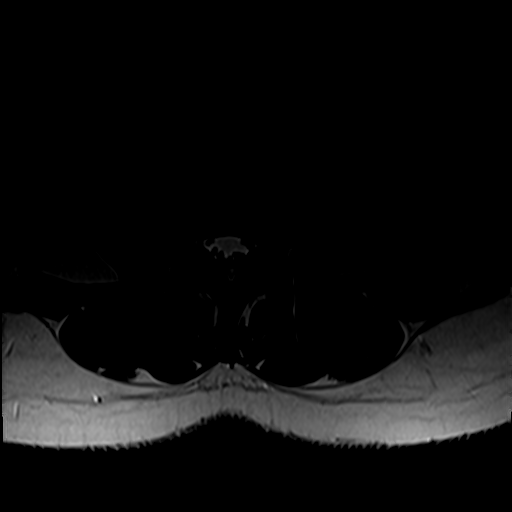
[im 21/42]
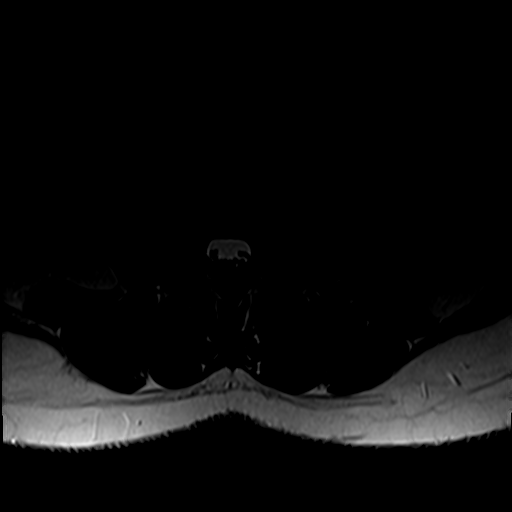
[im 24/42]
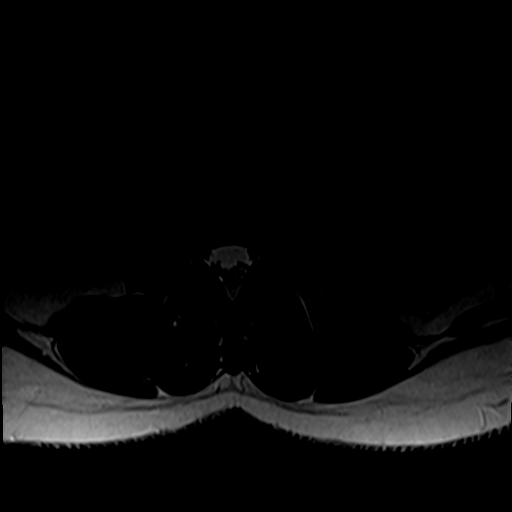
[im 30/42]
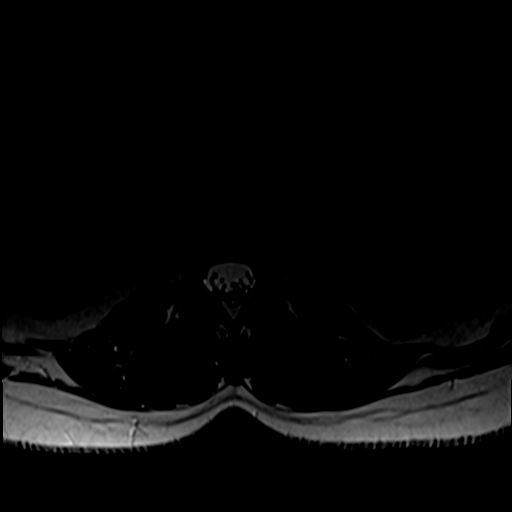
[im 36/42]
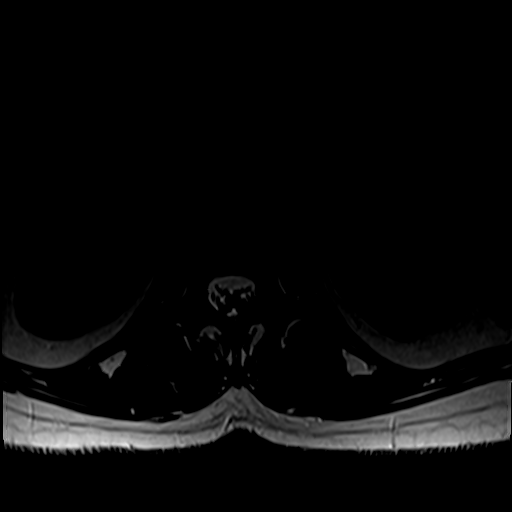
[im 42/42]
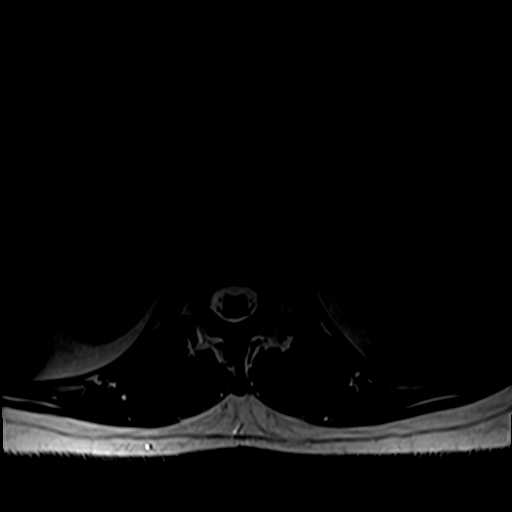

[Series 6: T1 · axial · 4.0mm · 0.39mm/px · z∈[-93,+93]mm · 5 of 42 slices shown (2 of 2)]
[im 1/42]
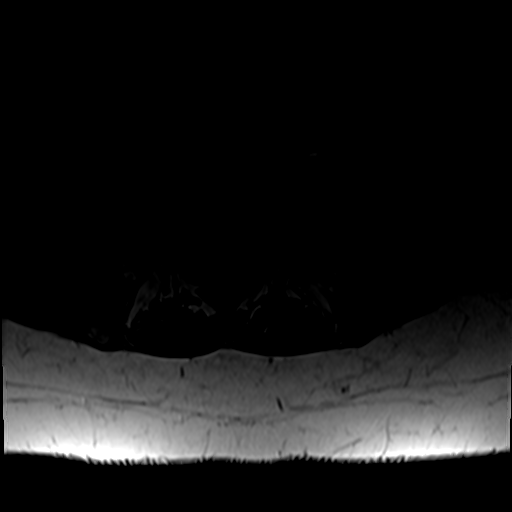
[im 6/42]
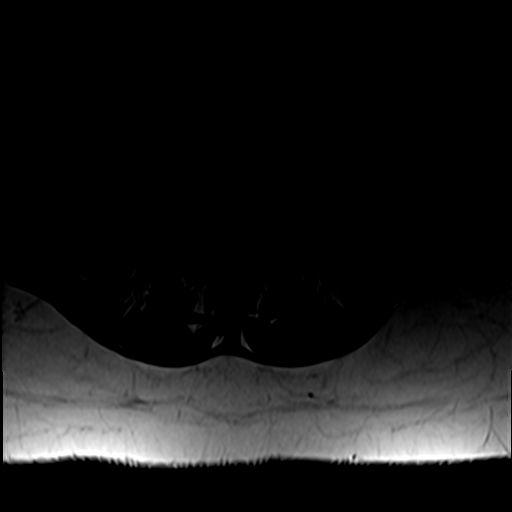
[im 12/42]
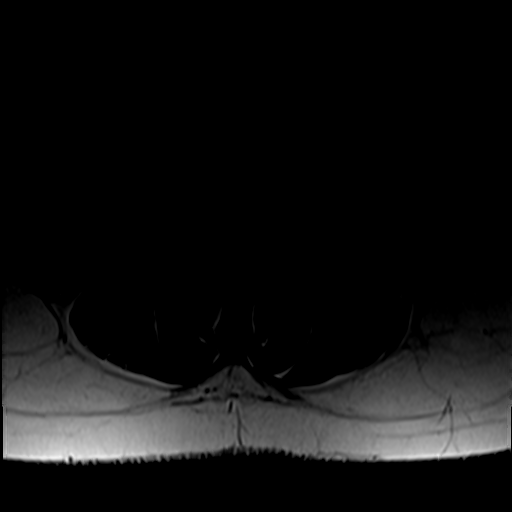
[im 21/42]
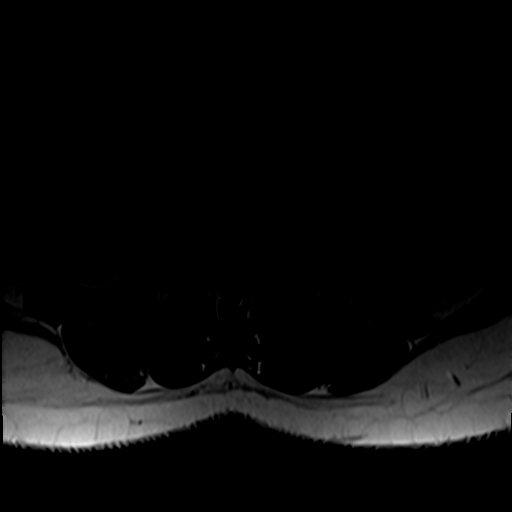
[im 36/42]
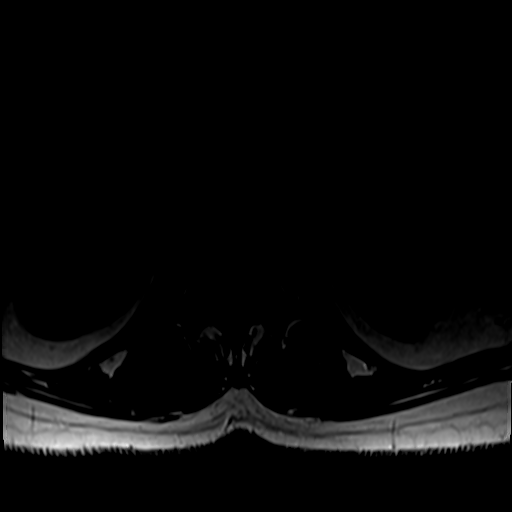

[26 of 48 positions shown; findings below may reference images not displayed]

FINDINGS: Segmentation: 5 lumbar vertebrae (correlating with prior lumbar
spine radiographs 02/25/2019.

Alignment: Trace L3-L4 and L4-L5 grade 1 retrolisthesis.

Vertebrae: Vertebral body height is maintained. Trace degenerative
endplate edema within the L4 posterior elements.

Conus medullaris and cauda equina: Conus extends to the L2 level. No
signal abnormality within the visualized distal spinal cord.

Paraspinal and other soft tissues: Gallbladder sludge. Incompletely
assessed right renal cysts. Paraspinal soft tissues within normal
limits.

Disc levels:

Mild disc degeneration at L3-L4, L4-L5 and L5-S1.

No significant disc herniation or stenosis at the T12-L1 through
L2-L3 levels.

L3-L4: Trace retrolisthesis. Bilateral foraminal posterior annular
fissures. Disc bulge. Superimposed small left foraminal disc
protrusion. Mild facet arthrosis/ligamentum flavum hypertrophy. No
significant spinal canal stenosis. Mild left neural foraminal
narrowing.

L4-L5: Trace retrolisthesis. Small disc bulge with mild endplate
spurring. Superimposed broad-based left subarticular/foraminal disc
protrusion. A mild facet arthrosis. The disc protrusion contributes
to mild left subarticular narrowing and contacts the descending left
L5 nerve root (series 5, image 32). Central canal patent.
Mild/moderate left neural foraminal narrowing.

L5-S1: Disc bulge. Superimposed tiny left subarticular disc
protrusion. The disc protrusion contributes to minimal left
subarticular narrowing and contacts the descending left S1 nerve
root (series 5, image 39). Central canal patent. No significant
foraminal stenosis.
IMPRESSION: Lumbar spondylosis as outlined with findings most notably as
follows.

At L3-L4, there is mild disc degeneration. Trace retrolisthesis. A
small left foraminal disc protrusion contributes to mild left neural
foraminal narrowing.

At L4-L5, there is mild disc degeneration. Trace retrolisthesis. A
broad-based left subarticular/foraminal disc protrusion contributes
to mild left subarticular narrowing, contacting the descending left
L5 nerve root. It also contributes to mild/moderate multifactorial
left neural foraminal narrowing.

At L5-S1, there is mild disc degeneration. A tiny left subarticular
disc protrusion contributes to minimal left subarticular narrowing,
and contacts the descending left S1 nerve root.

Also of note, mild facet arthrosis is present at L3-L4 and L4-L5.

Gallbladder sludge.

Right renal cysts.

## 2022-01-05 ENCOUNTER — Other Ambulatory Visit (HOSPITAL_COMMUNITY): Payer: Self-pay

## 2022-02-24 ENCOUNTER — Encounter: Payer: Self-pay | Admitting: Nurse Practitioner

## 2022-02-24 ENCOUNTER — Ambulatory Visit: Payer: 59 | Admitting: Nurse Practitioner

## 2022-02-24 ENCOUNTER — Other Ambulatory Visit (HOSPITAL_COMMUNITY): Payer: Self-pay

## 2022-02-24 VITALS — BP 122/86 | HR 68 | Temp 97.5°F | Wt 190.4 lb

## 2022-02-24 DIAGNOSIS — F419 Anxiety disorder, unspecified: Secondary | ICD-10-CM | POA: Diagnosis not present

## 2022-02-24 DIAGNOSIS — I059 Rheumatic mitral valve disease, unspecified: Secondary | ICD-10-CM

## 2022-02-24 DIAGNOSIS — J4530 Mild persistent asthma, uncomplicated: Secondary | ICD-10-CM

## 2022-02-24 DIAGNOSIS — E039 Hypothyroidism, unspecified: Secondary | ICD-10-CM | POA: Diagnosis not present

## 2022-02-24 DIAGNOSIS — G8929 Other chronic pain: Secondary | ICD-10-CM

## 2022-02-24 DIAGNOSIS — K589 Irritable bowel syndrome without diarrhea: Secondary | ICD-10-CM | POA: Diagnosis not present

## 2022-02-24 DIAGNOSIS — J309 Allergic rhinitis, unspecified: Secondary | ICD-10-CM

## 2022-02-24 DIAGNOSIS — E559 Vitamin D deficiency, unspecified: Secondary | ICD-10-CM | POA: Diagnosis not present

## 2022-02-24 DIAGNOSIS — Z0000 Encounter for general adult medical examination without abnormal findings: Secondary | ICD-10-CM

## 2022-02-24 DIAGNOSIS — M545 Low back pain, unspecified: Secondary | ICD-10-CM

## 2022-02-24 DIAGNOSIS — M542 Cervicalgia: Secondary | ICD-10-CM

## 2022-02-24 DIAGNOSIS — Z13220 Encounter for screening for lipoid disorders: Secondary | ICD-10-CM

## 2022-02-24 LAB — CBC WITH DIFFERENTIAL/PLATELET
Basophils Absolute: 0 10*3/uL (ref 0.0–0.1)
Basophils Relative: 0.5 % (ref 0.0–3.0)
Eosinophils Absolute: 0.3 10*3/uL (ref 0.0–0.7)
Eosinophils Relative: 3.7 % (ref 0.0–5.0)
HCT: 39.4 % (ref 36.0–46.0)
Hemoglobin: 13 g/dL (ref 12.0–15.0)
Lymphocytes Relative: 21.7 % (ref 12.0–46.0)
Lymphs Abs: 1.6 10*3/uL (ref 0.7–4.0)
MCHC: 33.1 g/dL (ref 30.0–36.0)
MCV: 93.3 fl (ref 78.0–100.0)
Monocytes Absolute: 0.4 10*3/uL (ref 0.1–1.0)
Monocytes Relative: 5.3 % (ref 3.0–12.0)
Neutro Abs: 4.9 10*3/uL (ref 1.4–7.7)
Neutrophils Relative %: 68.8 % (ref 43.0–77.0)
Platelets: 243 10*3/uL (ref 150.0–400.0)
RBC: 4.22 Mil/uL (ref 3.87–5.11)
RDW: 12.7 % (ref 11.5–15.5)
WBC: 7.2 10*3/uL (ref 4.0–10.5)

## 2022-02-24 LAB — T4, FREE: Free T4: 0.9 ng/dL (ref 0.60–1.60)

## 2022-02-24 LAB — COMPREHENSIVE METABOLIC PANEL
ALT: 24 U/L (ref 0–35)
AST: 25 U/L (ref 0–37)
Albumin: 4.1 g/dL (ref 3.5–5.2)
Alkaline Phosphatase: 69 U/L (ref 39–117)
BUN: 15 mg/dL (ref 6–23)
CO2: 30 mEq/L (ref 19–32)
Calcium: 9 mg/dL (ref 8.4–10.5)
Chloride: 102 mEq/L (ref 96–112)
Creatinine, Ser: 0.92 mg/dL (ref 0.40–1.20)
GFR: 72.83 mL/min (ref >60.00)
Glucose, Bld: 88 mg/dL (ref 70–99)
Potassium: 4.4 mEq/L (ref 3.5–5.1)
Sodium: 138 mEq/L (ref 135–145)
Total Bilirubin: 0.5 mg/dL (ref 0.2–1.2)
Total Protein: 6.6 g/dL (ref 6.0–8.3)

## 2022-02-24 LAB — LIPID PANEL
Cholesterol: 260 mg/dL — ABNORMAL HIGH (ref 0–200)
HDL: 44.1 mg/dL (ref >39.00)
NonHDL: 216.29
Total CHOL/HDL Ratio: 6
Triglycerides: 293 mg/dL — ABNORMAL HIGH (ref 0.0–149.0)
VLDL: 58.6 mg/dL — ABNORMAL HIGH (ref 0.0–40.0)

## 2022-02-24 LAB — VITAMIN D 25 HYDROXY (VIT D DEFICIENCY, FRACTURES): VITD: 85.93 ng/mL (ref 30.00–100.00)

## 2022-02-24 LAB — LDL CHOLESTEROL, DIRECT: Direct LDL: 159 mg/dL

## 2022-02-24 LAB — TSH: TSH: 0.89 u[IU]/mL (ref 0.35–5.50)

## 2022-02-24 MED ORDER — ALPRAZOLAM 0.5 MG PO TABS
0.5000 mg | ORAL_TABLET | Freq: Every day | ORAL | 0 refills | Status: DC | PRN
  Filled 2022-02-24: qty 30, 30d supply, fill #0

## 2022-02-24 MED ORDER — LEVOTHYROXINE SODIUM 75 MCG PO TABS
75.0000 ug | ORAL_TABLET | Freq: Every day | ORAL | 3 refills | Status: DC
  Filled 2022-02-24 – 2022-03-22 (×2): qty 90, 90d supply, fill #0
  Filled 2022-07-03: qty 90, 90d supply, fill #1
  Filled 2022-10-02: qty 90, 90d supply, fill #2
  Filled 2023-01-01: qty 90, 90d supply, fill #3

## 2022-02-24 MED ORDER — DICYCLOMINE HCL 10 MG PO CAPS
10.0000 mg | ORAL_CAPSULE | Freq: Three times a day (TID) | ORAL | 3 refills | Status: DC
  Filled 2022-02-24: qty 90, 23d supply, fill #0

## 2022-02-24 NOTE — Assessment & Plan Note (Signed)
Chronic, stable.  She states that her allergies can sometimes induce asthma.  She has an albuterol inhaler that she uses as needed.  Follow-up with any concerns or worsening symptoms.

## 2022-02-24 NOTE — Assessment & Plan Note (Signed)
Chronic, stable.  No worsening symptoms, chest pain, shortness of breath.  Follow-up with any concerns.

## 2022-02-24 NOTE — Assessment & Plan Note (Signed)
Chronic, stable.  She states that the duloxetine 60 mg daily is helping with her anxiety.  She also takes Xanax 0.5 mg as needed.  She states that her last bottle has lasted her over a year.  Refill of Xanax sent to the pharmacy.  PDMP reviewed.

## 2022-02-24 NOTE — Assessment & Plan Note (Signed)
Chronic, stable.  Well-controlled on duloxetine 60 mg daily and naltrexone 50 mg daily.

## 2022-02-24 NOTE — Assessment & Plan Note (Signed)
Chronic, stable.  She is currently following with PM&R.  She is on a good regimen of duloxetine 60 mg daily and naltrexone 50 mg daily.  She states that she still has some back pain and neck pain, however it is well controlled.  Continue collaboration recommendations with physical medicine.  She did ask if it was okay for PCP to take over this medication since she is stable.  I am okay with this, she will discuss this at her next visit with Dr. Dagoberto Ligas.

## 2022-02-24 NOTE — Progress Notes (Signed)
New Patient Office Visit  Subjective    Patient ID: Kelsey Coleman, female    DOB: 1970/12/22  Age: 51 y.o. MRN: [id number]  CC:  Chief Complaint  Patient presents with   Establish Care    Np. Est care. Pt is fasting. Requesting CPE and med review/refills. No main concerns    HPI Kelsey Coleman presents to transfer care to a new provider.  Introduced to Designer, jewellery role and practice setting.  All questions answered.  Discussed provider/patient relationship and expectations.  She has a history of heart murmur since she was a child.  She states that she had some echocardiograms and is now not needing to follow-up with cardiology.  She denies chest pain, shortness of breath.  She has a history of hypothyroidism and is taking levothyroxine 75 mcg daily.  She has a history of anxiety and is taking duloxetine 60 mg daily along with Xanax 0.5 mg as needed.  She states that her 30-day supply of Xanax has lasted her for a year.  Her symptoms are overall very well controlled.  She has a history of chronic back pain and neck pain and is seeing physical medicine and rehab.  She got a good regimen including Loxitane 60 mg daily and naltrexone 50 mg daily.  She states that it does not completely take away the pain, however it is controlled.  She also has a history of IBS that is well controlled.  She has several food allergies and she tries to avoid them.  She takes dicyclomine as needed.     02/24/2022    8:57 AM 10/21/2021    9:21 AM 04/22/2021    9:29 AM 02/23/2021   10:46 AM 11/19/2020    8:59 AM  Depression screen PHQ 2/9  Decreased Interest 0 0 0 0 0  Down, Depressed, Hopeless 0 0 0 0 0  PHQ - 2 Score 0 0 0 0 0  Altered sleeping 0   1   Tired, decreased energy 0   1   Change in appetite 0   0   Feeling bad or failure about yourself  0   0   Trouble concentrating 0   0   Moving slowly or fidgety/restless 0   0   Suicidal thoughts 0   0   PHQ-9 Score 0   2   Difficult doing  work/chores    Not difficult at all       02/24/2022    8:57 AM 02/23/2021   10:46 AM  GAD 7 : Generalized Anxiety Score  Nervous, Anxious, on Edge 0 0  Control/stop worrying 0 0  Worry too much - different things 0 0  Trouble relaxing 1 0  Restless 0 0  Easily annoyed or irritable 0 0  Afraid - awful might happen 0 0  Total GAD 7 Score 1 0    Outpatient Encounter Medications as of 02/24/2022  Medication Sig   dicyclomine (BENTYL) 10 MG capsule Take 1 capsule (10 mg total) by mouth 4 (four) times daily -  before meals and at bedtime.   albuterol (VENTOLIN HFA) 108 (90 Base) MCG/ACT inhaler INHALE TWO PUFFS BY MOUTH INTO THE LUNGS EVERY 4-6 HOURS IF NEEDED FOR COUGH OR WHEEZE.   ALPRAZolam (XANAX) 0.5 MG tablet Take 1 tablet (0.5 mg total) by mouth daily as needed for anxiety.   Calcium 500-100 MG-UNIT CHEW Calcium 500   cholecalciferol (VITAMIN D) 25 MCG (1000 UNIT) tablet Take 7 tablets (  7,000 Units total) by mouth daily.   cyclobenzaprine (FLEXERIL) 5 MG tablet Take 1 tablet (5 mg total) by mouth 3 (three) times daily as needed for muscle spasms.   DULoxetine (CYMBALTA) 60 MG capsule Take 1 capsule (60 mg total) by mouth daily.   levothyroxine (SYNTHROID) 75 MCG tablet Take 1 tablet (75 mcg total) by mouth daily.   loratadine (CLARITIN) 10 MG tablet Take 10 mg by mouth daily.   Magnesium 300 MG CAPS Take by mouth.   naltrexone (DEPADE) 50 MG tablet Take 0.5 tablets (25 mg total) by mouth daily. Pt actually taking low dose naltrexone 6 mg nightly- for chronic pain   [DISCONTINUED] ALPRAZolam (XANAX) 0.5 MG tablet 1 tablet   [DISCONTINUED] dicyclomine (BENTYL) 10 MG/ML injection 1 ml   [DISCONTINUED] levETIRAcetam (KEPPRA) 500 MG tablet Take 1 tablet (500 mg total) by mouth 2 (two) times daily. X 1 week, then 1000 mg 2x/day for low back pain   [DISCONTINUED] levothyroxine (SYNTHROID) 75 MCG tablet Take 1 tablet (75 mcg total) by mouth daily.   No facility-administered encounter  medications on file as of 02/24/2022.    Past Medical History:  Diagnosis Date   Allergy    dust, milk, pollen, tree nut-diarrhea,    Anxiety    Asthma    Heart murmur    IBS (irritable bowel syndrome)    Thyroid disease     Past Surgical History:  Procedure Laterality Date   ABLATION  05/16/2019   right ankle tendon repair     WISDOM TOOTH EXTRACTION      Family History  Problem Relation Age of Onset   Stroke Mother    Cancer Mother    Hyperlipidemia Mother    Anxiety disorder Mother    Cancer Father    Arthritis Father    Hearing loss Father    Alcohol abuse Sister    Mental illness Sister    Hearing loss Maternal Grandfather    Hypertension Paternal Grandmother    Kidney disease Paternal Grandmother    Anxiety disorder Sister    Colon polyps Neg Hx    Colon cancer Neg Hx    Esophageal cancer Neg Hx    Rectal cancer Neg Hx    Stomach cancer Neg Hx     Social History   Socioeconomic History   Marital status: Married    Spouse name: Not on file   Number of children: Not on file   Years of education: Not on file   Highest education level: Not on file  Occupational History   Not on file  Tobacco Use   Smoking status: Never   Smokeless tobacco: Never  Vaping Use   Vaping Use: Never used  Substance and Sexual Activity   Alcohol use: Yes    Comment: less than weekly, occasional   Drug use: Never   Sexual activity: Yes    Comment: Vasectomy  Other Topics Concern   Not on file  Social History Narrative   Not on file   Social Determinants of Health   Financial Resource Strain: Not on file  Food Insecurity: Not on file  Transportation Needs: Not on file  Physical Activity: Not on file  Stress: Not on file  Social Connections: Not on file  Intimate Partner Violence: Not on file    Review of Systems  Constitutional: Negative.   HENT: Negative.    Eyes: Negative.   Respiratory: Negative.    Cardiovascular: Negative.   Gastrointestinal:  Negative.  Genitourinary: Negative.   Musculoskeletal:  Positive for back pain and neck pain.  Skin: Negative.   Neurological: Negative.   Psychiatric/Behavioral: Negative.       Objective    BP 122/86   Pulse 68   Temp (!) 97.5 F (36.4 C) (Temporal)   Wt 190 lb 6.4 oz (86.4 kg)   SpO2 99%   BMI 31.68 kg/m   Physical Exam Vitals and nursing note reviewed.  Constitutional:      General: She is not in acute distress.    Appearance: Normal appearance.  HENT:     Head: Normocephalic and atraumatic.     Right Ear: Tympanic membrane, ear canal and external ear normal.     Left Ear: Tympanic membrane, ear canal and external ear normal.  Eyes:     Conjunctiva/sclera: Conjunctivae normal.  Cardiovascular:     Rate and Rhythm: Normal rate and regular rhythm.     Pulses: Normal pulses.     Heart sounds: Normal heart sounds.  Pulmonary:     Effort: Pulmonary effort is normal.     Breath sounds: Normal breath sounds.  Abdominal:     Palpations: Abdomen is soft.     Tenderness: There is no abdominal tenderness.  Musculoskeletal:        General: Normal range of motion.     Cervical back: Normal range of motion and neck supple. No tenderness.     Right lower leg: No edema.     Left lower leg: No edema.  Lymphadenopathy:     Cervical: No cervical adenopathy.  Skin:    General: Skin is warm and dry.  Neurological:     General: No focal deficit present.     Mental Status: She is alert and oriented to person, place, and time.     Cranial Nerves: No cranial nerve deficit.     Coordination: Coordination normal.     Gait: Gait normal.  Psychiatric:        Mood and Affect: Mood normal.        Behavior: Behavior normal.        Thought Content: Thought content normal.        Judgment: Judgment normal.       Assessment & Plan:   Problem List Items Addressed This Visit       Cardiovascular and Mediastinum   Disorder of mitral valve    Chronic, stable.  No worsening  symptoms, chest pain, shortness of breath.  Follow-up with any concerns.        Respiratory   Allergic rhinitis    Chronic, stable.  Continue loratadine 10 mg daily.      Mild persistent asthma    Chronic, stable.  She states that her allergies can sometimes induce asthma.  She has an albuterol inhaler that she uses as needed.  Follow-up with any concerns or worsening symptoms.        Digestive   Adaptive colitis    She is currently taking dicyclomine 10 mg as needed.  She states that this has overall gotten better and she does not even need to take this every day.  Follow-up with any concerns.        Endocrine   Hypothyroidism    Chronic, stable.  We will check TSH and free T4 today.  Levothyroxine 75 mcg daily refill sent to the pharmacy.      Relevant Medications   levothyroxine (SYNTHROID) 75 MCG tablet   Other Relevant Orders  TSH   T4, free     Other   Avitaminosis D    We will check vitamin D levels today and adjust regimen based on results.      Relevant Orders   VITAMIN D 25 Hydroxy (Vit-D Deficiency, Fractures)   Anxiety    Chronic, stable.  She states that the duloxetine 60 mg daily is helping with her anxiety.  She also takes Xanax 0.5 mg as needed.  She states that her last bottle has lasted her over a year.  Refill of Xanax sent to the pharmacy.  PDMP reviewed.      Relevant Medications   ALPRAZolam (XANAX) 0.5 MG tablet   Chronic right-sided low back pain without sciatica    Chronic, stable.  She is currently following with PM&R.  She is on a good regimen of duloxetine 60 mg daily and naltrexone 50 mg daily.  She states that she still has some back pain and neck pain, however it is well controlled.  Continue collaboration recommendations with physical medicine.  She did ask if it was okay for PCP to take over this medication since she is stable.  I am okay with this, she will discuss this at her next visit with Dr. Dagoberto Ligas.      Neck pain    Chronic,  stable.  Well-controlled on duloxetine 60 mg daily and naltrexone 50 mg daily.      Other Visit Diagnoses     Routine general medical examination at a health care facility    -  Primary   Relevant Orders   CBC with Differential/Platelet   Comprehensive metabolic panel   Screening, lipid       Relevant Orders   Lipid panel       Return in about 1 year (around 02/25/2023) for CPE.   Charyl Dancer, NP

## 2022-02-24 NOTE — Assessment & Plan Note (Signed)
We will check vitamin D levels today and adjust regimen based on results.

## 2022-02-24 NOTE — Assessment & Plan Note (Signed)
Chronic, stable.  We will check TSH and free T4 today.  Levothyroxine 75 mcg daily refill sent to the pharmacy.

## 2022-02-24 NOTE — Assessment & Plan Note (Signed)
She is currently taking dicyclomine 10 mg as needed.  She states that this has overall gotten better and she does not even need to take this every day.  Follow-up with any concerns.

## 2022-02-24 NOTE — Patient Instructions (Signed)
It was great to see you!  We are checking your labs today and will let you know the results via mychart/phone.   I have refilled the medications we discussed.  Let's follow-up in 1 year, sooner if you have concerns.  If a referral was placed today, you will be contacted for an appointment. Please note that routine referrals can sometimes take up to 3-4 weeks to process. Please call our office if you haven't heard anything after this time frame.  Take care,  Vance Peper, NP

## 2022-02-24 NOTE — Assessment & Plan Note (Signed)
Chronic, stable.  Continue loratadine 10 mg daily.

## 2022-03-23 ENCOUNTER — Other Ambulatory Visit (HOSPITAL_COMMUNITY): Payer: Self-pay

## 2022-04-21 ENCOUNTER — Encounter: Payer: 59 | Attending: Physical Medicine and Rehabilitation | Admitting: Physical Medicine and Rehabilitation

## 2022-04-21 ENCOUNTER — Other Ambulatory Visit (HOSPITAL_COMMUNITY): Payer: Self-pay

## 2022-04-21 ENCOUNTER — Encounter: Payer: Self-pay | Admitting: Physical Medicine and Rehabilitation

## 2022-04-21 VITALS — BP 127/85 | HR 62 | Ht 65.0 in | Wt 191.8 lb

## 2022-04-21 DIAGNOSIS — M542 Cervicalgia: Secondary | ICD-10-CM | POA: Insufficient documentation

## 2022-04-21 DIAGNOSIS — M545 Low back pain, unspecified: Secondary | ICD-10-CM | POA: Insufficient documentation

## 2022-04-21 DIAGNOSIS — G8929 Other chronic pain: Secondary | ICD-10-CM | POA: Diagnosis not present

## 2022-04-21 DIAGNOSIS — M7918 Myalgia, other site: Secondary | ICD-10-CM | POA: Insufficient documentation

## 2022-04-21 MED ORDER — ETODOLAC 400 MG PO TABS
400.0000 mg | ORAL_TABLET | Freq: Two times a day (BID) | ORAL | 3 refills | Status: DC
  Filled 2022-04-21: qty 60, 30d supply, fill #0

## 2022-04-21 MED ORDER — DULOXETINE HCL 60 MG PO CPEP
60.0000 mg | ORAL_CAPSULE | Freq: Every day | ORAL | 3 refills | Status: DC
  Filled 2022-04-21: qty 90, 90d supply, fill #0

## 2022-04-21 NOTE — Patient Instructions (Signed)
Pt is a 51 yr old female with hx of fibroids s/p ablation, Hyper methianemia  and IBS, hypothyroidism, and Anxiety with remote rx for Xanax (11/20)  Here for f/u for chronic pain/back pain  Also having neck pain and R biceps tendinitis which shoulder pain has resolved Neck pain still there.  Has multiple tendonitis sites- will try Etodolac /Lodine- 400 mg 2x/day x 10 days, then as needed- will send in #60 with 3 refills.   2. IF not effective, pt to CALL ME- and will send in Prednisone 40 mg daily x 5 days  3. Will Refill Low dose Naltrexone 6 mg daily #90 with 3 refills- called in  4. Refill Cymbalta 60 mg daily- #90 with 3 refills.    5.  Do therabands and leg exercises to help tendinitis-4x/week- only 10 minutes; Don't do UE weights/therabands until L side of neck better-  Other way to stretch Achilles- hang foot over edge of a stair to get a good stretch.  Lay on stomach and push up to extend back- that can also help R hip.   6. Episport tennis elbow/Epicondylitis clasp/brace- can use on either side- put velcro on inside of arm and "C" around outside, pushing on the muscles, not the actual elbow.    7. Upper trap- push straight down- hold pressure 2+ minutes; levators push into angle of neck at 45 degrees angle and scalenes- side of neck- push in AND back to get the best pressure. Have to hold firm pressure- can also add more and more pressure from light pressure over the 2 minutes.   8. Call me if doesn't work and will fit in for trigger point injections.    9.Get back to yoga!!!!  10. F/U in 3 months

## 2022-04-21 NOTE — Progress Notes (Signed)
Subjective:    Patient ID: Kelsey Coleman, female    DOB: October 05, 1970, 51 y.o.   MRN: [id number]  HPI  Pt is a 51 yr old female with hx of fibroids s/p ablation, Hyper methianemia  and IBS, hypothyroidism, and Anxiety with remote rx for Xanax (11/20)  Here for f/u for chronic pain/back pain  Also having neck pain and R biceps tendinitis which shoulder pain has resolved Neck pain still there.    Lots of new areas of pain.  L elbow pain/tennis elbow/extensor tendons on L forearm- hurts- uses grabber and ends up with pain from using grabber.  L heel- not sure what that's from- back of heel- more c/w Achilles tendinitis  R "hip bone"- affects her walking- notices as soon as gets up- when walks on it for a little, it gets a little/somewhat better. Thinks due ot walking a few miles with friend 3 weeks ago- not getting better.   Neck still hurting- hurts on L scalenes and will also tingle- like bugs crawling across L side of neck.   Hasn't been walking as much -walking partner moved to [place] and yoga instructor has gone away- instructor moved.      Pain Inventory Average Pain 6 Pain Right Now 6 My pain is burning, dull, tingling, and aching  In the last 24 hours, has pain interfered with the following? General activity 4 Relation with others 2 Enjoyment of life 3 What TIME of day is your pain at its worst? varies Sleep (in general) Good  Pain is worse with: bending, inactivity, standing, and some activites Pain improves with: medication Relief from Meds: 6  Family History  Problem Relation Age of Onset   Stroke Mother    Cancer Mother    Hyperlipidemia Mother    Anxiety disorder Mother    Cancer Father    Arthritis Father    Hearing loss Father    Alcohol abuse Sister    Mental illness Sister    Hearing loss Maternal Grandfather    Hypertension Paternal Grandmother    Kidney disease Paternal Grandmother    Anxiety disorder Sister    Colon polyps Neg Hx    Colon  cancer Neg Hx    Esophageal cancer Neg Hx    Rectal cancer Neg Hx    Stomach cancer Neg Hx    Social History   Socioeconomic History   Marital status: Married    Spouse name: Not on file   Number of children: Not on file   Years of education: Not on file   Highest education level: Not on file  Occupational History   Not on file  Tobacco Use   Smoking status: Never   Smokeless tobacco: Never  Vaping Use   Vaping Use: Never used  Substance and Sexual Activity   Alcohol use: Yes    Comment: less than weekly, occasional   Drug use: Never   Sexual activity: Yes    Comment: Vasectomy  Other Topics Concern   Not on file  Social History Narrative   Not on file   Social Determinants of Health   Financial Resource Strain: Not on file  Food Insecurity: Not on file  Transportation Needs: Not on file  Physical Activity: Not on file  Stress: Not on file  Social Connections: Not on file   Past Surgical History:  Procedure Laterality Date   ABLATION  05/16/2019   right ankle tendon repair     WISDOM TOOTH EXTRACTION  Past Surgical History:  Procedure Laterality Date   ABLATION  05/16/2019   right ankle tendon repair     WISDOM TOOTH EXTRACTION     Past Medical History:  Diagnosis Date   Allergy    dust, milk, pollen, tree nut-diarrhea,    Anxiety    Asthma    Heart murmur    IBS (irritable bowel syndrome)    Thyroid disease    BP 127/85   Pulse 62   Ht '5\' 5"'$  (1.651 m)   Wt 191 lb 12.8 oz (87 kg)   SpO2 97%   BMI 31.92 kg/m   Opioid Risk Score:   Fall Risk Score:  `1  Depression screen [facility] 2/9     04/21/2022    9:28 AM 02/24/2022    8:57 AM 10/21/2021    9:21 AM 04/22/2021    9:29 AM 02/23/2021   10:46 AM 11/19/2020    8:59 AM 04/07/2020   10:17 AM  Depression screen PHQ 2/9  Decreased Interest 0 0 0 0 0 0 0  Down, Depressed, Hopeless 0 0 0 0 0 0 0  PHQ - 2 Score 0 0 0 0 0 0 0  Altered sleeping  0   1  0  Tired, decreased energy  0   1  0  Change  in appetite  0   0  0  Feeling bad or failure about yourself   0   0  0  Trouble concentrating  0   0  0  Moving slowly or fidgety/restless  0   0  0  Suicidal thoughts  0   0  0  PHQ-9 Score  0   2  0  Difficult doing work/chores     Not difficult at all       Review of Systems  Constitutional: Negative.   HENT: Negative.    Eyes: Negative.   Respiratory: Negative.    Cardiovascular: Negative.   Gastrointestinal: Negative.   Endocrine: Negative.   Genitourinary: Negative.   Musculoskeletal: Negative.   Skin: Negative.   Allergic/Immunologic: Negative.   Neurological: Negative.   Hematological: Negative.   Psychiatric/Behavioral: Negative.        Objective:   Physical Exam  Awake, alert, appropriate, NAD Has Achilles tendinitis on L foot _TTP over tip of heel, not Plantar fasciitis Also has TTP over L extensor/origin of tendon of L forearm- c/w lateral epicondylitis.  Also has a snapping hip on R      Assessment & Plan:   Pt is a 51 yr old female with hx of fibroids s/p ablation, Hyper methianemia  and IBS, hypothyroidism, and Anxiety with remote rx for Xanax (11/20)  Here for f/u for chronic pain/back pain  Also having neck pain and R biceps tendinitis which shoulder pain has resolved Neck pain still there.  Has multiple tendonitis sites- will try Etodolac /Lodine- 400 mg 2x/day x 10 days, then as needed- will send in #60 with 3 refills.   2. IF not effective, pt to CALL ME- and will send in Prednisone 40 mg daily x 5 days  3. Will Refill Low dose Naltrexone 6 mg daily #90 with 3 refills- called in  4. Refill Cymbalta 60 mg daily- #90 with 3 refills.    5.  Do therabands and leg exercises to help tendinitis-4x/week- only 10 minutes; Don't do UE weights/therabands until L side of neck better-  Other way to stretch Achilles- hang foot over edge of a stair  to get a good stretch.  Lay on stomach and push up to extend back- that can also help R hip.   6. Episport  tennis elbow/Epicondylitis clasp/brace- can use on either side- put velcro on inside of arm and "C" around outside, pushing on the muscles, not the actual elbow.    7. Upper trap- push straight down- hold pressure 2+ minutes; levators push into angle of neck at 45 degrees angle and scalenes- side of neck- push in AND back to get the best pressure. Have to hold firm pressure- can also add more and more pressure from light pressure over the 2 minutes.   8. Call me if doesn't work and will fit in for trigger point injections.    9.Get back to yoga!!!!  10. F/U in 3 months   I spent a total of 31   minutes on total care today- >50% coordination of care- due to education on myofascial trigger point work; and stretches as detailed above.

## 2022-05-04 ENCOUNTER — Encounter: Payer: Self-pay | Admitting: Physical Medicine and Rehabilitation

## 2022-05-11 DIAGNOSIS — D2372 Other benign neoplasm of skin of left lower limb, including hip: Secondary | ICD-10-CM | POA: Diagnosis not present

## 2022-05-11 DIAGNOSIS — L814 Other melanin hyperpigmentation: Secondary | ICD-10-CM | POA: Diagnosis not present

## 2022-05-11 DIAGNOSIS — D1801 Hemangioma of skin and subcutaneous tissue: Secondary | ICD-10-CM | POA: Diagnosis not present

## 2022-05-11 DIAGNOSIS — D225 Melanocytic nevi of trunk: Secondary | ICD-10-CM | POA: Diagnosis not present

## 2022-05-11 DIAGNOSIS — D2272 Melanocytic nevi of left lower limb, including hip: Secondary | ICD-10-CM | POA: Diagnosis not present

## 2022-05-11 DIAGNOSIS — L819 Disorder of pigmentation, unspecified: Secondary | ICD-10-CM | POA: Diagnosis not present

## 2022-05-11 DIAGNOSIS — L720 Epidermal cyst: Secondary | ICD-10-CM | POA: Diagnosis not present

## 2022-05-12 ENCOUNTER — Other Ambulatory Visit (HOSPITAL_COMMUNITY): Payer: Self-pay

## 2022-05-12 ENCOUNTER — Telehealth: Payer: Self-pay

## 2022-05-12 MED ORDER — PREDNISONE 10 MG PO TABS
40.0000 mg | ORAL_TABLET | Freq: Every day | ORAL | 0 refills | Status: AC
  Filled 2022-05-12: qty 20, 5d supply, fill #0

## 2022-05-12 NOTE — Telephone Encounter (Signed)
Notified. 

## 2022-05-12 NOTE — Addendum Note (Signed)
Addended by: Courtney Heys on: 05/12/2022 12:07 PM   Modules accepted: Orders

## 2022-05-12 NOTE — Telephone Encounter (Signed)
Patient called stating she had sent a Mychart message last week and wanted to know if it had been addressed. I informed her that I would send a message to Dr. Dagoberto Ligas. Please see below for patient's message  The Lodine helped somewhat initially but then I walked Monday and I'm hurting all over again with no relief from the Lodine. Please go ahead with the 5 days of Prednisone to the [place]

## 2022-06-14 ENCOUNTER — Encounter: Payer: 59 | Attending: Physical Medicine and Rehabilitation | Admitting: Physical Medicine and Rehabilitation

## 2022-06-14 ENCOUNTER — Encounter: Payer: Self-pay | Admitting: Physical Medicine and Rehabilitation

## 2022-06-14 VITALS — BP 108/73 | HR 67 | Ht 65.0 in | Wt 191.0 lb

## 2022-06-14 DIAGNOSIS — M7918 Myalgia, other site: Secondary | ICD-10-CM | POA: Diagnosis not present

## 2022-06-14 DIAGNOSIS — R52 Pain, unspecified: Secondary | ICD-10-CM | POA: Insufficient documentation

## 2022-06-14 MED ORDER — LIDOCAINE HCL 1 % IJ SOLN
3.0000 mL | Freq: Once | INTRAMUSCULAR | Status: AC
  Administered 2022-06-14: 3 mL

## 2022-06-14 NOTE — Patient Instructions (Signed)
  Plan: Patient here for trigger point injections for  Consent done and on chart.  Cleaned areas with alcohol and injected using a 27 gauge 1.5 inch needle  Injected 3cc- no wastage Using 1% Lidocaine with no EPI  Upper traps- L x2 Levators- L x2 Posterior scalenes- L only Middle scalenes- L only Splenius Capitus L only-  Pectoralis Major Rhomboids Infraspinatus Teres Major/minor Thoracic paraspinals Lumbar paraspinals Other injections- L forearm/extensor compartment and L cervical paraspinals   Patient's level of pain prior was 4-5/10 Current level of pain after injections is- improved ROM- feels better  There was no bleeding or complications except pt felt a little nauseated and hot within a few minutes of injections- monitored and passed within 3-5 minutes. Felt better when left the office- advised can feel achy tonight/today esp from lactic acid- needs to flush system to improve Sx's.   Patient was advised to drink a lot of water on day after injections to flush system Will have increased soreness for 12-48 hours after injections.  Can use Lidocaine patches the day AFTER injections Can use theracane on day of injections in places didn't inject Can use heating pad 4-6 hours AFTER injections   2. Not using Etodolac right now- no improvement  3. Con't Duloxetine and Flexeril- doesn't need refills today.   4. F/U as scheduled in November.

## 2022-06-14 NOTE — Progress Notes (Signed)
Pt is a 51 year old female with hx of Fibroids, s/p ablation, Hypermethianemia, IBS, hypothyroidism, and anxiety Here for chronic back pain and neck and R bicipital tendinitis pain.             Heel stopped hurting, but knees back to hurting.   Prednisone didn't help pain- used ot have more area that were bothering her- but now Knees and L elbow/arm still hurting. [place] theracane several time,s but don't think got correct spots- didn't feel a difference.   Still hurting in same places.   Going to [place]. Will not help pain due to cold.   L side of neck feeling about the same- some days worse, but back to baseline.     Plan: Patient here for trigger point injections for  Consent done and on chart.  Cleaned areas with alcohol and injected using a 27 gauge 1.5 inch needle  Injected 3cc- no wastage Using 1% Lidocaine with no EPI  Upper traps- L x2 Levators- L x2 Posterior scalenes- L only Middle scalenes- L only Splenius Capitus L only-  Pectoralis Major Rhomboids Infraspinatus Teres Major/minor Thoracic paraspinals Lumbar paraspinals Other injections- L forearm/extensor compartment and L cervical paraspinals   Patient's level of pain prior was 4-5/10 Current level of pain after injections is- improved ROM- feels better  There was no bleeding or complications except pt felt a little nauseated and hot within a few minutes of injections- monitored and passed within 3-5 minutes. Felt better when left the office- advised can feel achy tonight/today esp from lactic acid- needs to flush system to improve Sx's.   Patient was advised to drink a lot of water on day after injections to flush system Will have increased soreness for 12-48 hours after injections.  Can use Lidocaine patches the day AFTER injections Can use theracane on day of injections in places didn't inject Can use heating pad 4-6 hours AFTER injections   2. Not using Etodolac right now- no  improvement  3. Con't Duloxetine and Flexeril- doesn't need refills today.   4. F/U as scheduled in November.

## 2022-07-03 ENCOUNTER — Other Ambulatory Visit (HOSPITAL_COMMUNITY): Payer: Self-pay

## 2022-07-12 ENCOUNTER — Other Ambulatory Visit (HOSPITAL_COMMUNITY): Payer: Self-pay

## 2022-07-12 ENCOUNTER — Encounter: Payer: Self-pay | Admitting: Physical Medicine and Rehabilitation

## 2022-07-12 ENCOUNTER — Encounter: Payer: 59 | Attending: Physical Medicine and Rehabilitation | Admitting: Physical Medicine and Rehabilitation

## 2022-07-12 VITALS — BP 110/75 | HR 62 | Ht 65.0 in | Wt 191.4 lb

## 2022-07-12 DIAGNOSIS — M542 Cervicalgia: Secondary | ICD-10-CM | POA: Diagnosis not present

## 2022-07-12 DIAGNOSIS — M7918 Myalgia, other site: Secondary | ICD-10-CM

## 2022-07-12 MED ORDER — LIDOCAINE HCL 1 % IJ SOLN: 3.0000 mL | Freq: Once | INTRAMUSCULAR | Status: AC

## 2022-07-12 MED ORDER — CYCLOBENZAPRINE HCL 5 MG PO TABS
5.0000 mg | ORAL_TABLET | Freq: Three times a day (TID) | ORAL | 3 refills | Status: DC | PRN
  Filled 2022-07-12: qty 180, 60d supply, fill #0

## 2022-07-12 MED ORDER — DULOXETINE HCL 60 MG PO CPEP
60.0000 mg | ORAL_CAPSULE | Freq: Every day | ORAL | 3 refills | Status: DC
  Filled 2022-07-12: qty 90, 90d supply, fill #0
  Filled 2022-10-02: qty 90, 90d supply, fill #1
  Filled 2023-01-18: qty 90, 90d supply, fill #2

## 2022-07-12 NOTE — Patient Instructions (Addendum)
Plan: Will refill Duloxetine 06 mg daily #90 with 3 refills  2. Will refill Flexeril?Cyclobenzaprine 10 mg up to 3x/day as needed #180 with 3 refills   3. Patient here for trigger point injections for myofascial pain  Consent done and on chart.  Cleaned areas with alcohol and injected using a 27 gauge 1.5 inch needle  Injected 2cc- wasted 1cc of 1% Lidocaine Using 1% Lidocaine with no EPI  Upper traps L only Levators- L only x2 Posterior scalenes L only x2 Middle scalenes- L only x2 Splenius Capitus Pectoralis Major Rhomboids Infraspinatus Teres Major/minor Thoracic paraspinals Lumbar paraspinals Other injections-    Patient's level of pain prior was 3-4/10- but neck was really tight Current level of pain after injections is- can turn head easier- no nausea/dizziness- down slightly to 2-3/10 There was no bleeding or complications.  Patient was advised to drink a lot of water on day after injections to flush system Will have increased soreness for 12-48 hours after injections.  Can use Lidocaine patches the day AFTER injections Can use theracane on day of injections in places didn't inject Can use heating pad 4-6 hours AFTER injections   3. Can use lidocaine patches over the counter on neck when gets real tight. Suggest 12 hrs on;12 hrs off- esp when traveling. Can use up to 3 patches at once.   4. F/U in 6 weeks For TrP injections.   5. 5. Suggest a different pillow with 90 degrees for head/shoulder- also a cut out for head/support for neck can be helpful-

## 2022-07-12 NOTE — Progress Notes (Signed)
Pt is a 51 year old female with hx of Fibroids, s/p ablation, Hypermethianemia, IBS, hypothyroidism, and anxiety Here for chronic back pain and neck and R bicipital tendinitis pain.           Did better after last set of trigger point injections- no more nausea/dizziness/hot.    Things exactly the same- nothing worse, nothing better.   Knees and L arm/elbow still hurting . Heel pain hasn't come back so far.   Slept with heating pad on neck due to tightness   Plan: Will refill Duloxetine 06 mg daily #90 with 3 refills  2. Will refill Flexeril?Cyclobenzaprine 10 mg up to 3x/day as needed #180 with 3 refills   3. Patient here for trigger point injections for myofascial pain  Consent done and on chart.  Cleaned areas with alcohol and injected using a 27 gauge 1.5 inch needle  Injected 2cc- wasted 1cc of 1% Lidocaine Using 1% Lidocaine with no EPI  Upper traps L only Levators- L only x2 Posterior scalenes L only x2 Middle scalenes- L only x2 Splenius Capitus Pectoralis Major Rhomboids Infraspinatus Teres Major/minor Thoracic paraspinals Lumbar paraspinals Other injections-    Patient's level of pain prior was 3-4/10- but neck was really tight Current level of pain after injections is- can turn head easier- no nausea/dizziness- down slightly to 2-3/10 There was no bleeding or complications.  Patient was advised to drink a lot of water on day after injections to flush system Will have increased soreness for 12-48 hours after injections.  Can use Lidocaine patches the day AFTER injections Can use theracane on day of injections in places didn't inject Can use heating pad 4-6 hours AFTER injections   3. Can use lidocaine patches over the counter on neck when gets real tight. Suggest 12 hrs on;12 hrs off- esp when traveling. Can use up to 3 patches at once.   4. F/U in 6 weeks For TrP injections.   5. Suggest a different pillow with 90 degrees for head/shoulder  I  spent a total of     21 minutes on total care today- >50% coordination of care- due to 10 minutes on injections and 11 minutes discussing theracane/Lidocaine patches for neck tightnesss

## 2022-09-08 ENCOUNTER — Encounter
Payer: Commercial Managed Care - PPO | Attending: Physical Medicine and Rehabilitation | Admitting: Physical Medicine and Rehabilitation

## 2022-09-08 ENCOUNTER — Encounter: Payer: Self-pay | Admitting: Physical Medicine and Rehabilitation

## 2022-09-08 VITALS — BP 120/81 | HR 75 | Ht 65.0 in | Wt 190.8 lb

## 2022-09-08 DIAGNOSIS — M7918 Myalgia, other site: Secondary | ICD-10-CM | POA: Diagnosis not present

## 2022-09-08 DIAGNOSIS — E039 Hypothyroidism, unspecified: Secondary | ICD-10-CM | POA: Diagnosis not present

## 2022-09-08 DIAGNOSIS — E7219 Other disorders of sulfur-bearing amino-acid metabolism: Secondary | ICD-10-CM | POA: Insufficient documentation

## 2022-09-08 DIAGNOSIS — R52 Pain, unspecified: Secondary | ICD-10-CM | POA: Diagnosis not present

## 2022-09-08 DIAGNOSIS — M542 Cervicalgia: Secondary | ICD-10-CM | POA: Insufficient documentation

## 2022-09-08 NOTE — Patient Instructions (Signed)
Pt is a 52 year old female with hx of Fibroids, s/p ablation, Hypermethianemia, IBS, hypothyroidism, and anxiety Here for chronic back pain and neck and R bicipital tendinitis pain.                Is pressing on the neck sometimes- suggest holding pressure on it and push back a little  2. Stop Lidoderm since wasn't real helpful.    3. Make sure PCP keeps TSH around 1.0, not average range ~ 2-2.5 is too high- it will keep pain better controlled.    4. Taking Flexeril AS NEEDED is better- up to 20 mg at one time- max dose is 20 mg- if needed- don' take daily, since will develop tolerance.    5. Can try Black Cohosh- for hot flashes/night sweats. Talk to PCP about it- mother had breast cancer.   6. Con't Cymbalta 60 mg daily for nerve pain- doesn't need refills today   7. Con't Flexeril as needed- doesn't need refills   8. F/U in 6 months- on chronic pain- reminded where 2 biggest trigger points are in neck.

## 2022-09-08 NOTE — Progress Notes (Signed)
Subjective:    Patient ID: Kelsey Coleman, female    DOB: [birth year], 52 y.o.   MRN: [id number]  HPI Pt is a 52 year old female with hx of Fibroids, s/p ablation, Hypermethianemia, IBS, hypothyroidism, and anxiety Here for chronic back pain and neck and R bicipital tendinitis pain.               Overall pain is doing better.  No significant relief from TrP injections  Did the whole bo of lidoderm patches- gets 1 hour relief, but that's all.   Has tried Baclofen in past- didn't work Got Sutera pillow- feels like she loves it- and can tell can aggravate it when sits to watch TV Or reading- puts head down, and can tell that bothers her.   Has a pair of specific reading eyeglasses Has progressive bifocals, but doesn't wear when at home/work- just reading glasses when will be reading.   And forearms hurt when on computer all day.  Has elbow splints and a wrist splint as well- wears elbow splints when bothers her- ~ 2x/month will use them- takes the edge off/helps but   Also hold pressure on neck when needs it and helps- and can move her neck better. But doesn't use theracane- hasn't asked husband lately to help.   Hasn't been as active- so thinks the reason her knees and hips have calmed down.   Last thyroid checked 6 months ago by PCP - TSH 0.89 so doing well Vit D level 85- which is great- takes ~ 7000 units/day.   Only took Etodolac when had that flare up.   Went back to yoga as well.  Takes flexeril if has active day- - 2x/week -   GYN appointments next week.   Daughter is starting to have same complaints- at 51 years old.    Pain Inventory Average Pain 2 Pain Right Now 0 My pain is intermittent and dull  In the last 24 hours, has pain interfered with the following? General activity 0 Relation with others 0 Enjoyment of life 0 What TIME of day is your pain at its worst? varies Sleep (in general) Fair  Pain is worse with: some activites Pain improves with: rest  and heat/ice Relief from Meds:  na  Family History  Problem Relation Age of Onset   Stroke Mother    Cancer Mother    Hyperlipidemia Mother    Anxiety disorder Mother    Cancer Father    Arthritis Father    Hearing loss Father    Alcohol abuse Sister    Mental illness Sister    Hearing loss Maternal Grandfather    Hypertension Paternal Grandmother    Kidney disease Paternal Grandmother    Anxiety disorder Sister    Colon polyps Neg Hx    Colon cancer Neg Hx    Esophageal cancer Neg Hx    Rectal cancer Neg Hx    Stomach cancer Neg Hx    Social History   Socioeconomic History   Marital status: Married    Spouse name: Not on file   Number of children: Not on file   Years of education: Not on file   Highest education level: Not on file  Occupational History   Not on file  Tobacco Use   Smoking status: Never   Smokeless tobacco: Never  Vaping Use   Vaping Use: Never used  Substance and Sexual Activity   Alcohol use: Yes    Comment: less than weekly,  occasional   Drug use: Never   Sexual activity: Yes    Comment: Vasectomy  Other Topics Concern   Not on file  Social History Narrative   Not on file   Social Determinants of Health   Financial Resource Strain: Not on file  Food Insecurity: Not on file  Transportation Needs: Not on file  Physical Activity: Not on file  Stress: Not on file  Social Connections: Not on file   Past Surgical History:  Procedure Laterality Date   ABLATION  05/16/2019   right ankle tendon repair     WISDOM TOOTH EXTRACTION     Past Surgical History:  Procedure Laterality Date   ABLATION  05/16/2019   right ankle tendon repair     WISDOM TOOTH EXTRACTION     Past Medical History:  Diagnosis Date   Allergy    dust, milk, pollen, tree nut-diarrhea,    Anxiety    Asthma    Heart murmur    IBS (irritable bowel syndrome)    Thyroid disease    BP 120/81   Pulse 75   Ht '5\' 5"'$  (1.651 m)   Wt 190 lb 12.8 oz (86.5 kg)   SpO2  97%   BMI 31.75 kg/m   Opioid Risk Score:   Fall Risk Score:  `1  Depression screen [facility] 2/9     07/12/2022   10:42 AM 06/14/2022   10:07 AM 04/21/2022    9:28 AM 02/24/2022    8:57 AM 10/21/2021    9:21 AM 04/22/2021    9:29 AM 02/23/2021   10:46 AM  Depression screen PHQ 2/9  Decreased Interest 0 0 0 0 0 0 0  Down, Depressed, Hopeless 0 0 0 0 0 0 0  PHQ - 2 Score 0 0 0 0 0 0 0  Altered sleeping    0   1  Tired, decreased energy    0   1  Change in appetite    0   0  Feeling bad or failure about yourself     0   0  Trouble concentrating    0   0  Moving slowly or fidgety/restless    0   0  Suicidal thoughts    0   0  PHQ-9 Score    0   2  Difficult doing work/chores       Not difficult at all     Review of Systems  Musculoskeletal:  Positive for back pain and neck pain.       Forearm pain Bilateral knee pain  All other systems reviewed and are negative.      Objective:   Physical Exam  Awake, alert, appropriate, looks great, NAD TrIgger points palpated in neck and upper back No sweating from menopausal Sx's     Assessment & Plan:   Pt is a 52 year old female with hx of Fibroids, s/p ablation, Hypermethianemia, IBS, hypothyroidism, and anxiety Here for chronic back pain and neck and R bicipital tendinitis pain.                Is pressing on the neck sometimes- suggest holding pressure on it and push back a little  2. Stop Lidoderm since wasn't real helpful.    3. Make sure PCP keeps TSH around 1.0, not average range ~ 2-2.5 is too high- it will keep pain better controlled.    4. Taking Flexeril AS NEEDED is better- up to 20 mg at one  time- max dose is 20 mg- if needed- don' take daily, since will develop tolerance.    5. Can try Black Cohosh- for hot flashes/night sweats. Talk to PCP about it- mother had breast cancer.   6. Con't Cymbalta 60 mg daily for nerve pain- doesn't need refills today   7. Con't Flexeril as needed- doesn't need  refills   8. F/U in 6 months- on chronic pain   I spent a total of  26  minutes on total care today- >50% coordination of care- due to education on menopausal Sx's; myofascial education, and discussion of meds.

## 2022-09-13 DIAGNOSIS — E559 Vitamin D deficiency, unspecified: Secondary | ICD-10-CM | POA: Diagnosis not present

## 2022-09-13 DIAGNOSIS — Z9189 Other specified personal risk factors, not elsewhere classified: Secondary | ICD-10-CM | POA: Diagnosis not present

## 2022-09-13 DIAGNOSIS — Z309 Encounter for contraceptive management, unspecified: Secondary | ICD-10-CM | POA: Diagnosis not present

## 2022-09-13 DIAGNOSIS — Z1231 Encounter for screening mammogram for malignant neoplasm of breast: Secondary | ICD-10-CM | POA: Diagnosis not present

## 2022-09-13 DIAGNOSIS — Z1151 Encounter for screening for human papillomavirus (HPV): Secondary | ICD-10-CM | POA: Diagnosis not present

## 2022-09-13 DIAGNOSIS — Z124 Encounter for screening for malignant neoplasm of cervix: Secondary | ICD-10-CM | POA: Diagnosis not present

## 2022-09-13 DIAGNOSIS — Z6831 Body mass index (BMI) 31.0-31.9, adult: Secondary | ICD-10-CM | POA: Diagnosis not present

## 2022-09-13 DIAGNOSIS — Z01419 Encounter for gynecological examination (general) (routine) without abnormal findings: Secondary | ICD-10-CM | POA: Diagnosis not present

## 2022-09-13 LAB — RESULTS CONSOLE HPV: CHL HPV: NEGATIVE

## 2022-09-13 LAB — HM PAP SMEAR

## 2022-09-13 LAB — HM MAMMOGRAPHY

## 2022-11-15 ENCOUNTER — Other Ambulatory Visit: Payer: Self-pay | Admitting: Nurse Practitioner

## 2022-11-15 ENCOUNTER — Other Ambulatory Visit (HOSPITAL_COMMUNITY): Payer: Self-pay

## 2022-11-15 MED ORDER — ALPRAZOLAM 0.5 MG PO TABS
0.5000 mg | ORAL_TABLET | Freq: Every day | ORAL | 0 refills | Status: DC | PRN
  Filled 2022-11-15: qty 30, 30d supply, fill #0

## 2023-03-05 ENCOUNTER — Other Ambulatory Visit (HOSPITAL_COMMUNITY): Payer: Self-pay

## 2023-03-05 ENCOUNTER — Encounter: Payer: Self-pay | Admitting: Nurse Practitioner

## 2023-03-05 ENCOUNTER — Ambulatory Visit (INDEPENDENT_AMBULATORY_CARE_PROVIDER_SITE_OTHER): Payer: Commercial Managed Care - PPO | Admitting: Nurse Practitioner

## 2023-03-05 VITALS — BP 118/88 | HR 61 | Temp 98.4°F | Ht 65.0 in | Wt 194.2 lb

## 2023-03-05 DIAGNOSIS — E039 Hypothyroidism, unspecified: Secondary | ICD-10-CM | POA: Diagnosis not present

## 2023-03-05 DIAGNOSIS — E559 Vitamin D deficiency, unspecified: Secondary | ICD-10-CM | POA: Diagnosis not present

## 2023-03-05 DIAGNOSIS — I059 Rheumatic mitral valve disease, unspecified: Secondary | ICD-10-CM

## 2023-03-05 DIAGNOSIS — F419 Anxiety disorder, unspecified: Secondary | ICD-10-CM

## 2023-03-05 DIAGNOSIS — E782 Mixed hyperlipidemia: Secondary | ICD-10-CM | POA: Diagnosis not present

## 2023-03-05 DIAGNOSIS — R52 Pain, unspecified: Secondary | ICD-10-CM

## 2023-03-05 DIAGNOSIS — Z0000 Encounter for general adult medical examination without abnormal findings: Secondary | ICD-10-CM

## 2023-03-05 LAB — COMPREHENSIVE METABOLIC PANEL
ALT: 28 U/L (ref 0–35)
AST: 24 U/L (ref 0–37)
Albumin: 4.2 g/dL (ref 3.5–5.2)
Alkaline Phosphatase: 65 U/L (ref 39–117)
BUN: 16 mg/dL (ref 6–23)
CO2: 28 mEq/L (ref 19–32)
Calcium: 9.2 mg/dL (ref 8.4–10.5)
Chloride: 102 mEq/L (ref 96–112)
Creatinine, Ser: 0.98 mg/dL (ref 0.40–1.20)
GFR: 67.03 mL/min (ref >60.00)
Glucose, Bld: 93 mg/dL (ref 70–99)
Potassium: 4.3 mEq/L (ref 3.5–5.1)
Sodium: 138 mEq/L (ref 135–145)
Total Bilirubin: 0.8 mg/dL (ref 0.2–1.2)
Total Protein: 6.7 g/dL (ref 6.0–8.3)

## 2023-03-05 LAB — CBC WITH DIFFERENTIAL/PLATELET
Basophils Absolute: 0 10*3/uL (ref 0.0–0.1)
Basophils Relative: 0.8 % (ref 0.0–3.0)
Eosinophils Absolute: 0.3 10*3/uL (ref 0.0–0.7)
Eosinophils Relative: 4.6 % (ref 0.0–5.0)
HCT: 39.2 % (ref 36.0–46.0)
Hemoglobin: 12.9 g/dL (ref 12.0–15.0)
Lymphocytes Relative: 25.1 % (ref 12.0–46.0)
Lymphs Abs: 1.5 10*3/uL (ref 0.7–4.0)
MCHC: 33 g/dL (ref 30.0–36.0)
MCV: 94.2 fl (ref 78.0–100.0)
Monocytes Absolute: 0.3 10*3/uL (ref 0.1–1.0)
Monocytes Relative: 5.2 % (ref 3.0–12.0)
Neutro Abs: 4 10*3/uL (ref 1.4–7.7)
Neutrophils Relative %: 64.3 % (ref 43.0–77.0)
Platelets: 270 10*3/uL (ref 150.0–400.0)
RBC: 4.16 Mil/uL (ref 3.87–5.11)
RDW: 13.1 % (ref 11.5–15.5)
WBC: 6.1 10*3/uL (ref 4.0–10.5)

## 2023-03-05 LAB — VITAMIN D 25 HYDROXY (VIT D DEFICIENCY, FRACTURES): VITD: 53.89 ng/mL (ref 30.00–100.00)

## 2023-03-05 LAB — LIPID PANEL
Cholesterol: 286 mg/dL — ABNORMAL HIGH (ref 0–200)
HDL: 45 mg/dL (ref >39.00)
LDL Cholesterol: 207 mg/dL — ABNORMAL HIGH (ref 0–99)
NonHDL: 240.87
Total CHOL/HDL Ratio: 6
Triglycerides: 169 mg/dL — ABNORMAL HIGH (ref 0.0–149.0)
VLDL: 33.8 mg/dL (ref 0.0–40.0)

## 2023-03-05 LAB — TSH: TSH: 0.56 u[IU]/mL (ref 0.35–5.50)

## 2023-03-05 MED ORDER — ALPRAZOLAM 0.5 MG PO TABS
0.5000 mg | ORAL_TABLET | Freq: Every day | ORAL | 0 refills | Status: DC | PRN
  Filled 2023-03-05: qty 30, 30d supply, fill #0

## 2023-03-05 MED ORDER — LEVOTHYROXINE SODIUM 75 MCG PO TABS
75.0000 ug | ORAL_TABLET | Freq: Every day | ORAL | 3 refills | Status: DC
  Filled 2023-03-05 – 2023-03-26 (×2): qty 90, 90d supply, fill #0
  Filled 2023-06-26: qty 90, 90d supply, fill #1
  Filled 2023-09-24: qty 90, 90d supply, fill #2
  Filled 2023-12-22: qty 90, 90d supply, fill #3

## 2023-03-05 NOTE — Assessment & Plan Note (Signed)
Chronic, stable.  She states that the duloxetine 60 mg daily is helping with her anxiety.  She also takes Xanax 0.5 mg as needed.  She states that her last bottle has lasted her over a year.  Refill of Xanax sent to the pharmacy.  PDMP reviewed. 

## 2023-03-05 NOTE — Assessment & Plan Note (Signed)
Health maintenance reviewed and updated. Discussed nutrition, exercise. Check CMP, CBC today. Follow-up 1 year.   

## 2023-03-05 NOTE — Patient Instructions (Signed)
It was great to see you!  We are checking your labs today and will let you know the results via mychart/phone.   Keep up the great work!   Let's follow-up in 1 year, sooner if you have concerns.  If a referral was placed today, you will be contacted for an appointment. Please note that routine referrals can sometimes take up to 3-4 weeks to process. Please call our office if you haven't heard anything after this time frame.  Take care,  Javonne Dorko, NP  

## 2023-03-05 NOTE — Assessment & Plan Note (Addendum)
Chronic, stable. ASCVD score 2.1%. Check lipid panel today. Focus on nutrition and exercise.    The 10-year ASCVD risk score (Arnett DK, et al., 2019) is: 2.1%   Values used to calculate the score:     Age: 52 years     Sex: Female     Is Non-Hispanic African American: No     Diabetic: No     Tobacco smoker: No     Systolic Blood Pressure: 118 mmHg     Is BP treated: No     HDL Cholesterol: 44.1 mg/dL     Total Cholesterol: 260 mg/dL

## 2023-03-05 NOTE — Progress Notes (Signed)
BP 118/88 (BP Location: Left Arm)   Pulse 61   Temp 98.4 F (36.9 C)   Ht 5\' 5"  (1.651 m)   Wt 194 lb 3.2 oz (88.1 kg)   SpO2 99%   BMI 32.32 kg/m    Subjective:    Patient ID: Kelsey Coleman, female    DOB: [birth year], 52 y.o.   MRN: [id number]  CC: Chief Complaint  Patient presents with   Annual Exam    With fasting labs, no concerns    HPI: Kelsey Coleman is a 52 y.o. female presenting on 03/05/2023 for comprehensive medical examination. Current medical complaints include:none  She currently lives with: husband Menopausal Symptoms: no  Depression and Anxiety Screen done today and results listed below:     03/05/2023    8:29 AM 07/12/2022   10:42 AM 06/14/2022   10:07 AM 04/21/2022    9:28 AM 02/24/2022    8:57 AM  Depression screen PHQ 2/9  Decreased Interest 0 0 0 0 0  Down, Depressed, Hopeless 0 0 0 0 0  PHQ - 2 Score 0 0 0 0 0  Altered sleeping 0    0  Tired, decreased energy 0    0  Change in appetite 0    0  Feeling bad or failure about yourself  0    0  Trouble concentrating 0    0  Moving slowly or fidgety/restless 0    0  Suicidal thoughts 0    0  PHQ-9 Score 0    0      03/05/2023    8:29 AM 02/24/2022    8:57 AM 02/23/2021   10:46 AM  GAD 7 : Generalized Anxiety Score  Nervous, Anxious, on Edge 1 0 0  Control/stop worrying 0 0 0  Worry too much - different things 0 0 0  Trouble relaxing 1 1 0  Restless 0 0 0  Easily annoyed or irritable 0 0 0  Afraid - awful might happen 0 0 0  Total GAD 7 Score 2 1 0  Anxiety Difficulty Not difficult at all      The patient does not have a history of falls. I did not complete a risk assessment for falls. A plan of care for falls was not documented.   Past Medical History:  Past Medical History:  Diagnosis Date   Allergy    dust, milk, pollen, tree nut-diarrhea,    Anxiety    Asthma    Heart murmur    IBS (irritable bowel syndrome)    Thyroid disease     Surgical History:  Past Surgical History:   Procedure Laterality Date   ABLATION  05/16/2019   right ankle tendon repair     WISDOM TOOTH EXTRACTION      Medications:  Current Outpatient Medications on File Prior to Visit  Medication Sig   Calcium 500-100 MG-UNIT CHEW Calcium 500   cholecalciferol (VITAMIN D) 25 MCG (1000 UNIT) tablet Take 7 tablets (7,000 Units total) by mouth daily.   cyclobenzaprine (FLEXERIL) 5 MG tablet Take 1 tablet (5 mg total) by mouth 3 (three) times daily as needed for muscle spasms.   dicyclomine (BENTYL) 10 MG capsule Take 1 capsule (10 mg total) by mouth 4 (four) times daily -  before meals and at bedtime.   DULoxetine (CYMBALTA) 60 MG capsule Take 1 capsule (60 mg total) by mouth daily.   loratadine (CLARITIN) 10 MG tablet Take 10 mg by mouth daily.  Magnesium 300 MG CAPS Take by mouth.   naltrexone (DEPADE) 50 MG tablet Take 0.5 tablets (25 mg total) by mouth daily. Pt actually taking low dose naltrexone 6 mg nightly- for chronic pain   albuterol (VENTOLIN HFA) 108 (90 Base) MCG/ACT inhaler INHALE TWO PUFFS BY MOUTH INTO THE LUNGS EVERY 4-6 HOURS IF NEEDED FOR COUGH OR WHEEZE.   [DISCONTINUED] levETIRAcetam (KEPPRA) 500 MG tablet Take 1 tablet (500 mg total) by mouth 2 (two) times daily. X 1 week, then 1000 mg 2x/day for low back pain   Current Facility-Administered Medications on File Prior to Visit  Medication   lidocaine (XYLOCAINE) 1 % (with pres) injection 3 mL    Allergies:  Allergies  Allergen Reactions   Bee Pollen Itching and Swelling   Casein Diarrhea    Other reaction(s): Other   Chamomile Diarrhea   Honey Diarrhea   Honey Bee Venom Itching and Swelling   Iodinated Contrast Media Hives    Other reaction(s): Other   Milk-Related Compounds Other (See Comments)   Pollen Extract Cough, Diarrhea and Itching   Fruit Extracts Diarrhea and Itching    Social History:  Social History   Socioeconomic History   Marital status: Married    Spouse name: Not on file   Number of  children: Not on file   Years of education: Not on file   Highest education level: Not on file  Occupational History   Not on file  Tobacco Use   Smoking status: Never   Smokeless tobacco: Never  Vaping Use   Vaping Use: Never used  Substance and Sexual Activity   Alcohol use: Yes    Comment: less than weekly, occasional   Drug use: Never   Sexual activity: Yes    Comment: Vasectomy  Other Topics Concern   Not on file  Social History Narrative   Not on file   Social Determinants of Health   Financial Resource Strain: Not on file  Food Insecurity: Not on file  Transportation Needs: Not on file  Physical Activity: Not on file  Stress: Not on file  Social Connections: Not on file  Intimate Partner Violence: Not on file   Social History   Tobacco Use  Smoking Status Never  Smokeless Tobacco Never   Social History   Substance and Sexual Activity  Alcohol Use Yes   Comment: less than weekly, occasional    Family History:  Family History  Problem Relation Age of Onset   Stroke Mother    Cancer Mother    Hyperlipidemia Mother    Anxiety disorder Mother    Cancer Father    Arthritis Father    Hearing loss Father    Alcohol abuse Sister    Mental illness Sister    Hearing loss Maternal Grandfather    Hypertension Paternal Grandmother    Kidney disease Paternal Grandmother    Anxiety disorder Sister    Colon polyps Neg Hx    Colon cancer Neg Hx    Esophageal cancer Neg Hx    Rectal cancer Neg Hx    Stomach cancer Neg Hx     Past medical history, surgical history, medications, allergies, family history and social history reviewed with patient today and changes made to appropriate areas of the chart.   Review of Systems  Constitutional: Negative.   HENT: Negative.    Eyes: Negative.   Respiratory: Negative.    Cardiovascular: Negative.   Gastrointestinal: Negative.   Genitourinary: Negative.   Musculoskeletal: Negative.  Skin:        Healing more  slowly when gets scratch/rash  Neurological: Negative.   Psychiatric/Behavioral: Negative.     All other ROS negative except what is listed above and in the HPI.      Objective:    BP 118/88 (BP Location: Left Arm)   Pulse 61   Temp 98.4 F (36.9 C)   Ht 5\' 5"  (1.651 m)   Wt 194 lb 3.2 oz (88.1 kg)   SpO2 99%   BMI 32.32 kg/m   Wt Readings from Last 3 Encounters:  03/05/23 194 lb 3.2 oz (88.1 kg)  09/08/22 190 lb 12.8 oz (86.5 kg)  07/12/22 191 lb 6.4 oz (86.8 kg)    Physical Exam Vitals and nursing note reviewed.  Constitutional:      General: She is not in acute distress.    Appearance: Normal appearance.  HENT:     Head: Normocephalic and atraumatic.     Right Ear: Tympanic membrane, ear canal and external ear normal.     Left Ear: Tympanic membrane, ear canal and external ear normal.  Eyes:     Conjunctiva/sclera: Conjunctivae normal.  Cardiovascular:     Rate and Rhythm: Normal rate and regular rhythm.     Pulses: Normal pulses.     Heart sounds: Normal heart sounds.  Pulmonary:     Effort: Pulmonary effort is normal.     Breath sounds: Normal breath sounds.  Abdominal:     Palpations: Abdomen is soft.     Tenderness: There is no abdominal tenderness.  Musculoskeletal:        General: Normal range of motion.     Cervical back: Normal range of motion and neck supple.     Right lower leg: No edema.     Left lower leg: No edema.  Lymphadenopathy:     Cervical: No cervical adenopathy.  Skin:    General: Skin is warm and dry.  Neurological:     General: No focal deficit present.     Mental Status: She is alert and oriented to person, place, and time.     Cranial Nerves: No cranial nerve deficit.     Coordination: Coordination normal.     Gait: Gait normal.  Psychiatric:        Mood and Affect: Mood normal.        Behavior: Behavior normal.        Thought Content: Thought content normal.        Judgment: Judgment normal.     Results for orders placed  or performed in visit on 02/24/22  HM MAMMOGRAPHY  Result Value Ref Range   HM Mammogram 0-4 Bi-Rad 0-4 Bi-Rad, Self Reported Normal  CBC with Differential/Platelet  Result Value Ref Range   WBC 7.2 4.0 - 10.5 K/uL   RBC 4.22 3.87 - 5.11 Mil/uL   Hemoglobin 13.0 12.0 - 15.0 g/dL   HCT 16.1 09.6 - 04.5 %   MCV 93.3 78.0 - 100.0 fl   MCHC 33.1 30.0 - 36.0 g/dL   RDW 40.9 81.1 - 91.4 %   Platelets 243.0 150.0 - 400.0 K/uL   Neutrophils Relative % 68.8 43.0 - 77.0 %   Lymphocytes Relative 21.7 12.0 - 46.0 %   Monocytes Relative 5.3 3.0 - 12.0 %   Eosinophils Relative 3.7 0.0 - 5.0 %   Basophils Relative 0.5 0.0 - 3.0 %   Neutro Abs 4.9 1.4 - 7.7 K/uL   Lymphs Abs 1.6 0.7 -  4.0 K/uL   Monocytes Absolute 0.4 0.1 - 1.0 K/uL   Eosinophils Absolute 0.3 0.0 - 0.7 K/uL   Basophils Absolute 0.0 0.0 - 0.1 K/uL  Comprehensive metabolic panel  Result Value Ref Range   Sodium 138 135 - 145 mEq/L   Potassium 4.4 3.5 - 5.1 mEq/L   Chloride 102 96 - 112 mEq/L   CO2 30 19 - 32 mEq/L   Glucose, Bld 88 70 - 99 mg/dL   BUN 15 6 - 23 mg/dL   Creatinine, Ser 1.61 0.40 - 1.20 mg/dL   Total Bilirubin 0.5 0.2 - 1.2 mg/dL   Alkaline Phosphatase 69 39 - 117 U/L   AST 25 0 - 37 U/L   ALT 24 0 - 35 U/L   Total Protein 6.6 6.0 - 8.3 g/dL   Albumin 4.1 3.5 - 5.2 g/dL   GFR 09.60 >45.40 mL/min   Calcium 9.0 8.4 - 10.5 mg/dL  VITAMIN D 25 Hydroxy (Vit-D Deficiency, Fractures)  Result Value Ref Range   VITD 85.93 30.00 - 100.00 ng/mL  Lipid panel  Result Value Ref Range   Cholesterol 260 (H) 0 - 200 mg/dL   Triglycerides 981.1 (H) 0.0 - 149.0 mg/dL   HDL 91.47 >82.95 mg/dL   VLDL 62.1 (H) 0.0 - 30.8 mg/dL   Total CHOL/HDL Ratio 6    NonHDL 216.29   TSH  Result Value Ref Range   TSH 0.89 0.35 - 5.50 uIU/mL  T4, free  Result Value Ref Range   Free T4 0.90 0.60 - 1.60 ng/dL  LDL cholesterol, direct  Result Value Ref Range   Direct LDL 159.0 mg/dL      Assessment & Plan:   Problem List Items  Addressed This Visit       Cardiovascular and Mediastinum   Disorder of mitral valve    Chronic, stable.  No worsening symptoms, chest pain, shortness of breath.  Follow-up with any concerns.        Endocrine   Hypothyroidism    Chronic, stable.  We will check TSH today.  Levothyroxine 75 mcg daily refill sent to the pharmacy.      Relevant Medications   levothyroxine (SYNTHROID) 75 MCG tablet   Other Relevant Orders   TSH     Other   Avitaminosis D    Check vitamin D levels and adjust regimen based on results.       Relevant Orders   VITAMIN D 25 Hydroxy (Vit-D Deficiency, Fractures)   Anxiety    Chronic, stable.  She states that the duloxetine 60 mg daily is helping with her anxiety.  She also takes Xanax 0.5 mg as needed.  She states that her last bottle has lasted her over a year.  Refill of Xanax sent to the pharmacy.  PDMP reviewed.      Relevant Medications   ALPRAZolam (XANAX) 0.5 MG tablet   Generalized pain    Chronic, stable. She is currently following with Dr. Berline Chough. Continue duloxetine 60mg  daily and naltrexone 44m daily as prescribed by specialist.       Mixed hyperlipidemia    Chronic, stable. ASCVD score 2.1%. Check lipid panel today. Focus on nutrition and exercise.    The 10-year ASCVD risk score (Arnett DK, et al., 2019) is: 2.1%   Values used to calculate the score:     Age: 16 years     Sex: Female     Is Non-Hispanic African American: No     Diabetic: No  Tobacco smoker: No     Systolic Blood Pressure: 118 mmHg     Is BP treated: No     HDL Cholesterol: 44.1 mg/dL     Total Cholesterol: 260 mg/dL       Relevant Orders   Lipid panel   Routine general medical examination at a health care facility - Primary    Health maintenance reviewed and updated. Discussed nutrition, exercise. Check CMP, CBC today. Follow-up 1 year.        Relevant Orders   CBC with Differential/Platelet   Comprehensive metabolic panel     Follow up  plan: Return in about 1 year (around 03/04/2024) for CPE.   LABORATORY TESTING:  - Pap smear: done elsewhere  IMMUNIZATIONS:   - Tdap: Tetanus vaccination status reviewed: last tetanus booster within 10 years. - Influenza: Postponed to flu season - Pneumovax: Not applicable - Prevnar: Not applicable - HPV: Not applicable - Zostavax vaccine: Refused  SCREENING: -Mammogram: Done elsewhere  - Colonoscopy: Up to date  - Bone Density: Not applicable   PATIENT COUNSELING:   Advised to take 1 mg of folate supplement per day if capable of pregnancy.   Sexuality: Discussed sexually transmitted diseases, partner selection, use of condoms, avoidance of unintended pregnancy  and contraceptive alternatives.   Advised to avoid cigarette smoking.  I discussed with the patient that most people either abstain from alcohol or drink within safe limits (<=14/week and <=4 drinks/occasion for males, <=7/weeks and <= 3 drinks/occasion for females) and that the risk for alcohol disorders and other health effects rises proportionally with the number of drinks per week and how often a drinker exceeds daily limits.  Discussed cessation/primary prevention of drug use and availability of treatment for abuse.   Diet: Encouraged to adjust caloric intake to maintain  or achieve ideal body weight, to reduce intake of dietary saturated fat and total fat, to limit sodium intake by avoiding high sodium foods and not adding table salt, and to maintain adequate dietary potassium and calcium preferably from fresh fruits, vegetables, and low-fat dairy products.    stressed the importance of regular exercise  Injury prevention: Discussed safety belts, safety helmets, smoke detector, smoking near bedding or upholstery.   Dental health: Discussed importance of regular tooth brushing, flossing, and dental visits.    NEXT PREVENTATIVE PHYSICAL DUE IN 1 YEAR. Return in about 1 year (around 03/04/2024) for CPE.

## 2023-03-05 NOTE — Assessment & Plan Note (Signed)
Chronic, stable.  No worsening symptoms, chest pain, shortness of breath.  Follow-up with any concerns. 

## 2023-03-05 NOTE — Assessment & Plan Note (Signed)
Chronic, stable. She is currently following with Dr. Berline Chough. Continue duloxetine 60mg  daily and naltrexone 60m daily as prescribed by specialist.

## 2023-03-05 NOTE — Assessment & Plan Note (Signed)
Check vitamin D levels and adjust regimen based on results.  °

## 2023-03-05 NOTE — Assessment & Plan Note (Signed)
Chronic, stable.  We will check TSH today.  Levothyroxine 75 mcg daily refill sent to the pharmacy.

## 2023-03-06 ENCOUNTER — Encounter: Payer: Self-pay | Admitting: Nurse Practitioner

## 2023-03-07 ENCOUNTER — Other Ambulatory Visit (HOSPITAL_COMMUNITY): Payer: Self-pay

## 2023-03-07 MED ORDER — ROSUVASTATIN CALCIUM 20 MG PO TABS
20.0000 mg | ORAL_TABLET | Freq: Every day | ORAL | 1 refills | Status: DC
  Filled 2023-03-07: qty 90, 90d supply, fill #0
  Filled 2023-06-04: qty 90, 90d supply, fill #1

## 2023-03-07 NOTE — Telephone Encounter (Signed)
LVMTCB to schedule 3mon appt

## 2023-03-08 ENCOUNTER — Encounter: Payer: Self-pay | Admitting: Nurse Practitioner

## 2023-03-09 ENCOUNTER — Encounter: Payer: Self-pay | Admitting: Physical Medicine and Rehabilitation

## 2023-03-09 ENCOUNTER — Other Ambulatory Visit (HOSPITAL_COMMUNITY): Payer: Self-pay

## 2023-03-09 ENCOUNTER — Encounter
Payer: Commercial Managed Care - PPO | Attending: Physical Medicine and Rehabilitation | Admitting: Physical Medicine and Rehabilitation

## 2023-03-09 VITALS — BP 113/79 | HR 73 | Ht 65.0 in | Wt 194.0 lb

## 2023-03-09 DIAGNOSIS — M7918 Myalgia, other site: Secondary | ICD-10-CM | POA: Insufficient documentation

## 2023-03-09 DIAGNOSIS — G8929 Other chronic pain: Secondary | ICD-10-CM | POA: Insufficient documentation

## 2023-03-09 DIAGNOSIS — M25562 Pain in left knee: Secondary | ICD-10-CM | POA: Diagnosis not present

## 2023-03-09 DIAGNOSIS — M25561 Pain in right knee: Secondary | ICD-10-CM | POA: Insufficient documentation

## 2023-03-09 DIAGNOSIS — M542 Cervicalgia: Secondary | ICD-10-CM | POA: Insufficient documentation

## 2023-03-09 MED ORDER — DULOXETINE HCL 60 MG PO CPEP
60.0000 mg | ORAL_CAPSULE | Freq: Every day | ORAL | 3 refills | Status: DC
  Filled 2023-03-09 – 2023-04-18 (×2): qty 90, 90d supply, fill #0
  Filled 2023-06-26 – 2023-06-29 (×2): qty 90, 90d supply, fill #1
  Filled 2023-10-14: qty 90, 90d supply, fill #2
  Filled 2024-01-15: qty 90, 90d supply, fill #3

## 2023-03-09 NOTE — Progress Notes (Signed)
Subjective:    Patient ID: Kelsey Coleman, female    DOB: 10-31-1970, 52 y.o.   MRN: [id number]  HPI Pt is a 52 year old female with hx of Fibroids, s/p ablation, Hypermethianemia, IBS, hypothyroidism, and anxiety and myofascial pain Here for chronic back pain and neck and R bicipital tendinitis pain.       Doesn't want trigger point injections today.   Things "great'=- neck "completely better- ROM is back and pain is gone!!! Not sure what did it.    Has even had a few trips to [place] and no problems.   Hasn't needed theracane.   Forearm pain also resolved.    B/L knees-  both the same . Feels it when gets up from sitting position- mainly is when she notices them.   Weight has been creeping up- so attributes it to gaining weight.  Just picked up- hasn't started Cholesterol medicine- cholesterol up to 286- so needs to start meds.  Has family hx of it.   Doesn't eat dairy or fatty foods.   Taking Cymbalta 60 mg daily, but not taking Flexeril.  Takes low dose naltrexone   Back to "her normal".     Pain Inventory Average Pain 2 Pain Right Now 2 My pain is intermittent, dull, and aching  In the last 24 hours, has pain interfered with the following? General activity 2 Relation with others 1 Enjoyment of life 2 What TIME of day is your pain at its worst? night Sleep (in general) Good  Pain is worse with: bending, standing, and some activites Pain improves with: rest, heat/ice, and pacing activities Relief from Meds:  none  Family History  Problem Relation Age of Onset   Stroke Mother    Cancer Mother    Hyperlipidemia Mother    Anxiety disorder Mother    Cancer Father    Arthritis Father    Hearing loss Father    Alcohol abuse Sister    Mental illness Sister    Hearing loss Maternal Grandfather    Hypertension Paternal Grandmother    Kidney disease Paternal Grandmother    Anxiety disorder Sister    Colon polyps Neg Hx    Colon cancer Neg Hx     Esophageal cancer Neg Hx    Rectal cancer Neg Hx    Stomach cancer Neg Hx    Social History   Socioeconomic History   Marital status: Married    Spouse name: Not on file   Number of children: Not on file   Years of education: Not on file   Highest education level: Not on file  Occupational History   Not on file  Tobacco Use   Smoking status: Never   Smokeless tobacco: Never  Vaping Use   Vaping status: Never Used  Substance and Sexual Activity   Alcohol use: Yes    Comment: less than weekly, occasional   Drug use: Never   Sexual activity: Yes    Comment: Vasectomy  Other Topics Concern   Not on file  Social History Narrative   Not on file   Social Determinants of Health   Financial Resource Strain: Not on file  Food Insecurity: Not on file  Transportation Needs: Not on file  Physical Activity: Not on file  Stress: Not on file  Social Connections: Not on file   Past Surgical History:  Procedure Laterality Date   ABLATION  05/16/2019   right ankle tendon repair     WISDOM TOOTH EXTRACTION  Past Surgical History:  Procedure Laterality Date   ABLATION  05/16/2019   right ankle tendon repair     WISDOM TOOTH EXTRACTION     Past Medical History:  Diagnosis Date   Allergy    dust, milk, pollen, tree nut-diarrhea,    Anxiety    Asthma    Heart murmur    IBS (irritable bowel syndrome)    Thyroid disease    BP 113/79   Pulse 73   Ht 5\' 5"  (1.651 m)   Wt 194 lb (88 kg)   SpO2 95%   BMI 32.28 kg/m   Opioid Risk Score:   Fall Risk Score:  `1  Depression screen [facility] 2/9     03/05/2023    8:29 AM 07/12/2022   10:42 AM 06/14/2022   10:07 AM 04/21/2022    9:28 AM 02/24/2022    8:57 AM 10/21/2021    9:21 AM 04/22/2021    9:29 AM  Depression screen PHQ 2/9  Decreased Interest 0 0 0 0 0 0 0  Down, Depressed, Hopeless 0 0 0 0 0 0 0  PHQ - 2 Score 0 0 0 0 0 0 0  Altered sleeping 0    0    Tired, decreased energy 0    0    Change in appetite 0    0     Feeling bad or failure about yourself  0    0    Trouble concentrating 0    0    Moving slowly or fidgety/restless 0    0    Suicidal thoughts 0    0    PHQ-9 Score 0    0       Review of Systems  Musculoskeletal:  Positive for back pain.       B/L knee pain  All other systems reviewed and are negative.      Objective:   Physical Exam Awake, alert, appropriate, sitting on exam table, NAD Crepitus B/L in knees Mild effusion/trace- medially- but not noted laterally No baker's cyst B/L  TTP diffusely over B/L knees-        Assessment & Plan:   Pt is a 52 year old female with hx of Fibroids, s/p ablation, Hypermethianemia, IBS, hypothyroidism, and anxiety and myofascial pain Here for chronic back pain and neck and R bicipital tendinitis pain.       Voltaren gel- is over the counter- it's topical- can use up to 4grams- up to 4x/day. As needed. For knee other joint pain- glucoasamine chondroitin- which might help slow progress of Osteoarthritis- brand is important- [employer] brand actually did the best. Most consistent dose- and most effective. Would have to be taken das the bottle suggest- 1-2 tabs 2x/day usually. .   2. Con't Low dose naltrexone- sent in today 6 mg nightly- for 1 year supply- 3 months a t a time. [facility].   3. Con't Duloxetine 60 mg nightly- 3 months supply with 3 refills.   4. F/U in 1 year But call me if things get worse for any reason.     I spent a total of 21   minutes on total care today- >50% coordination of care- due to  Calling in Rx and discussing f/u with pt if need be for exacerbation. Also d/w pt about forming osteoarthritis.

## 2023-03-09 NOTE — Patient Instructions (Addendum)
Pt is a 52 year old female with hx of Fibroids, s/p ablation, Hypermethianemia, IBS, hypothyroidism, and anxiety and myofascial pain Here for chronic back pain and neck and R bicipital tendinitis pain.       Voltaren gel- is over the counter- it's topical- can use up to 4grams- up to 4x/day. As needed. For knee other joint pain- glucoasamine chondroitin- which might help slow progress of Osteoarthritis- brand is important- [employer] brand actually did the best. Most consistent dose- and most effective. Would have to be taken das the bottle suggest- 1-2 tabs 2x/day usually. .   2. Con't Low dose naltrexone- sent in today 6 mg nightly- for 1 year supply- 3 months a t a time. [facility].   3. Con't Duloxetine 60 mg nightly- 3 months supply with 3 refills.   4. F/U in 1 year But call me if things get worse for any reason.

## 2023-03-26 ENCOUNTER — Other Ambulatory Visit (HOSPITAL_COMMUNITY): Payer: Self-pay

## 2023-04-05 ENCOUNTER — Other Ambulatory Visit: Payer: Self-pay | Admitting: Oncology

## 2023-04-05 DIAGNOSIS — Z006 Encounter for examination for normal comparison and control in clinical research program: Secondary | ICD-10-CM

## 2023-04-18 ENCOUNTER — Other Ambulatory Visit (HOSPITAL_COMMUNITY): Payer: Self-pay

## 2023-06-05 ENCOUNTER — Other Ambulatory Visit (HOSPITAL_COMMUNITY): Payer: Self-pay

## 2023-06-05 ENCOUNTER — Encounter: Payer: Self-pay | Admitting: Nurse Practitioner

## 2023-06-05 ENCOUNTER — Ambulatory Visit: Payer: Commercial Managed Care - PPO | Admitting: Nurse Practitioner

## 2023-06-05 VITALS — BP 118/76 | HR 60 | Temp 97.5°F | Wt 195.8 lb

## 2023-06-05 DIAGNOSIS — E782 Mixed hyperlipidemia: Secondary | ICD-10-CM

## 2023-06-05 LAB — COMPREHENSIVE METABOLIC PANEL
ALT: 35 U/L (ref 0–35)
AST: 33 U/L (ref 0–37)
Albumin: 4.2 g/dL (ref 3.5–5.2)
Alkaline Phosphatase: 64 U/L (ref 39–117)
BUN: 16 mg/dL (ref 6–23)
CO2: 28 meq/L (ref 19–32)
Calcium: 9.5 mg/dL (ref 8.4–10.5)
Chloride: 104 meq/L (ref 96–112)
Creatinine, Ser: 0.94 mg/dL (ref 0.40–1.20)
GFR: 70.34 mL/min (ref >60.00)
Glucose, Bld: 94 mg/dL (ref 70–99)
Potassium: 4.1 meq/L (ref 3.5–5.1)
Sodium: 139 meq/L (ref 135–145)
Total Bilirubin: 0.4 mg/dL (ref 0.2–1.2)
Total Protein: 6.8 g/dL (ref 6.0–8.3)

## 2023-06-05 LAB — LIPID PANEL
Cholesterol: 147 mg/dL (ref 0–200)
HDL: 46.5 mg/dL (ref >39.00)
LDL Cholesterol: 83 mg/dL (ref 0–99)
NonHDL: 100.28
Total CHOL/HDL Ratio: 3
Triglycerides: 85 mg/dL (ref 0.0–149.0)
VLDL: 17 mg/dL (ref 0.0–40.0)

## 2023-06-05 MED ORDER — ROSUVASTATIN CALCIUM 20 MG PO TABS
20.0000 mg | ORAL_TABLET | Freq: Every day | ORAL | 1 refills | Status: DC
  Filled 2023-06-05: qty 90, 90d supply, fill #0
  Filled 2023-08-30: qty 90, 90d supply, fill #1

## 2023-06-05 NOTE — Patient Instructions (Signed)
It was great to see you!  We are checking your labs today and will let you know the results via mychart/phone.   I have refilled your medication.   Let's follow-up in 9 months, sooner if you have concerns.  If a referral was placed today, you will be contacted for an appointment. Please note that routine referrals can sometimes take up to 3-4 weeks to process. Please call our office if you haven't heard anything after this time frame.  Take care,  Rodman Pickle, NP

## 2023-06-05 NOTE — Assessment & Plan Note (Signed)
Chronic, ongoing. She was started on rosuvastatin 20 mg daily last visit.  She has been tolerating this well without any side effects.  Will check CMP, lipid panel today and adjust regimen based on results.  Refill sent to the pharmacy.  Follow-up in 9 months.

## 2023-06-05 NOTE — Progress Notes (Signed)
Established Patient Office Visit  Subjective   Patient ID: Kelsey Coleman, female    DOB: [birth year]  Age: 52 y.o. MRN: [id number]  Chief Complaint  Patient presents with   Hyperlipidemia    Follow up, Rx Refill    Hyperlipidemia    Kelsey Coleman is here to follow-up on hyperlipidemia.   Last visit she was started on rosuvastatin 20mg  daily. She is not having any side effects to the medication. She states that she has not changed her diet/nutrition. She denies chest pain and shortness of breath.     ROS See pertinent positives and negatives per HPI.    Objective:     BP 118/76 (BP Location: Left Arm)   Pulse 60   Temp (!) 97.5 F (36.4 C)   Wt 195 lb 12.8 oz (88.8 kg)   SpO2 98%   BMI 32.58 kg/m    Physical Exam Vitals and nursing note reviewed.  Constitutional:      General: She is not in acute distress.    Appearance: Normal appearance.  HENT:     Head: Normocephalic.  Eyes:     Conjunctiva/sclera: Conjunctivae normal.  Cardiovascular:     Rate and Rhythm: Normal rate and regular rhythm.     Pulses: Normal pulses.     Heart sounds: Normal heart sounds.  Pulmonary:     Effort: Pulmonary effort is normal.     Breath sounds: Normal breath sounds.  Musculoskeletal:     Cervical back: Normal range of motion.  Skin:    General: Skin is warm.  Neurological:     General: No focal deficit present.     Mental Status: She is alert and oriented to person, place, and time.  Psychiatric:        Mood and Affect: Mood normal.        Behavior: Behavior normal.        Thought Content: Thought content normal.        Judgment: Judgment normal.    The 10-year ASCVD risk score (Arnett DK, et al., 2019) is: 2.3%    Assessment & Plan:   Problem List Items Addressed This Visit       Other   Mixed hyperlipidemia - Primary    Chronic, ongoing. She was started on rosuvastatin 20 mg daily last visit.  She has been tolerating this well without any side effects.  Will  check CMP, lipid panel today and adjust regimen based on results.  Refill sent to the pharmacy.  Follow-up in 9 months.      Relevant Medications   rosuvastatin (CRESTOR) 20 MG tablet   Other Relevant Orders   Comprehensive metabolic panel   Lipid panel    Return in about 9 months (around 03/04/2024) for CPE.    Kelsey Scull, NP

## 2023-06-27 ENCOUNTER — Other Ambulatory Visit (HOSPITAL_COMMUNITY): Payer: Self-pay

## 2023-06-27 ENCOUNTER — Other Ambulatory Visit: Payer: Self-pay

## 2023-09-04 ENCOUNTER — Other Ambulatory Visit (HOSPITAL_COMMUNITY): Payer: Self-pay

## 2023-09-20 DIAGNOSIS — Z6833 Body mass index (BMI) 33.0-33.9, adult: Secondary | ICD-10-CM | POA: Diagnosis not present

## 2023-09-20 DIAGNOSIS — Z01419 Encounter for gynecological examination (general) (routine) without abnormal findings: Secondary | ICD-10-CM | POA: Diagnosis not present

## 2023-09-20 DIAGNOSIS — Z1231 Encounter for screening mammogram for malignant neoplasm of breast: Secondary | ICD-10-CM | POA: Diagnosis not present

## 2023-09-28 ENCOUNTER — Other Ambulatory Visit (HOSPITAL_COMMUNITY)
Admission: RE | Admit: 2023-09-28 | Discharge: 2023-09-28 | Disposition: A | Discharge status: Home / Self Care | Payer: Self-pay | Source: Ambulatory Visit | Attending: Oncology | Admitting: Oncology

## 2023-09-28 DIAGNOSIS — Z006 Encounter for examination for normal comparison and control in clinical research program: Secondary | ICD-10-CM | POA: Insufficient documentation

## 2023-10-08 LAB — GENECONNECT MOLECULAR SCREEN: Genetic Analysis Overall Interpretation: NEGATIVE

## 2023-11-27 ENCOUNTER — Other Ambulatory Visit (HOSPITAL_COMMUNITY): Payer: Self-pay

## 2023-11-27 ENCOUNTER — Other Ambulatory Visit: Payer: Self-pay | Admitting: Nurse Practitioner

## 2023-11-27 MED ORDER — ROSUVASTATIN CALCIUM 20 MG PO TABS
20.0000 mg | ORAL_TABLET | Freq: Every day | ORAL | 1 refills | Status: DC
  Filled 2023-11-27: qty 90, 90d supply, fill #0
  Filled 2024-02-26: qty 90, 90d supply, fill #1

## 2023-11-27 NOTE — Telephone Encounter (Signed)
 Requesting: rosuvastatin (CRESTOR) 20 MG tablet  Last Visit: 06/05/2023 Next Visit: Visit date not found Last Refill: 06/05/2023  Please Advise   Due for follow up by 03-04-24

## 2023-12-22 ENCOUNTER — Other Ambulatory Visit: Payer: Self-pay | Admitting: Nurse Practitioner

## 2023-12-24 ENCOUNTER — Other Ambulatory Visit: Payer: Self-pay

## 2023-12-24 ENCOUNTER — Other Ambulatory Visit (HOSPITAL_COMMUNITY): Payer: Self-pay

## 2023-12-24 MED ORDER — DICYCLOMINE HCL 10 MG PO CAPS
10.0000 mg | ORAL_CAPSULE | Freq: Three times a day (TID) | ORAL | 3 refills | Status: AC
  Filled 2023-12-24: qty 90, 23d supply, fill #0

## 2023-12-28 ENCOUNTER — Other Ambulatory Visit (HOSPITAL_COMMUNITY): Payer: Self-pay

## 2024-03-06 ENCOUNTER — Ambulatory Visit: Admitting: Nurse Practitioner

## 2024-03-06 ENCOUNTER — Other Ambulatory Visit (HOSPITAL_COMMUNITY): Payer: Self-pay

## 2024-03-06 VITALS — BP 118/82 | HR 59 | Temp 97.5°F | Ht 65.0 in | Wt 200.0 lb

## 2024-03-06 DIAGNOSIS — Z0000 Encounter for general adult medical examination without abnormal findings: Secondary | ICD-10-CM | POA: Diagnosis not present

## 2024-03-06 DIAGNOSIS — E039 Hypothyroidism, unspecified: Secondary | ICD-10-CM | POA: Diagnosis not present

## 2024-03-06 DIAGNOSIS — E559 Vitamin D deficiency, unspecified: Secondary | ICD-10-CM

## 2024-03-06 DIAGNOSIS — J4520 Mild intermittent asthma, uncomplicated: Secondary | ICD-10-CM | POA: Diagnosis not present

## 2024-03-06 DIAGNOSIS — Z23 Encounter for immunization: Secondary | ICD-10-CM

## 2024-03-06 DIAGNOSIS — E782 Mixed hyperlipidemia: Secondary | ICD-10-CM | POA: Diagnosis not present

## 2024-03-06 DIAGNOSIS — R61 Generalized hyperhidrosis: Secondary | ICD-10-CM | POA: Diagnosis not present

## 2024-03-06 DIAGNOSIS — F419 Anxiety disorder, unspecified: Secondary | ICD-10-CM

## 2024-03-06 DIAGNOSIS — R52 Pain, unspecified: Secondary | ICD-10-CM

## 2024-03-06 LAB — COMPREHENSIVE METABOLIC PANEL WITH GFR
ALT: 30 U/L (ref 0–35)
AST: 30 U/L (ref 0–37)
Albumin: 4.4 g/dL (ref 3.5–5.2)
Alkaline Phosphatase: 61 U/L (ref 39–117)
BUN: 18 mg/dL (ref 6–23)
CO2: 30 meq/L (ref 19–32)
Calcium: 8.9 mg/dL (ref 8.4–10.5)
Chloride: 104 meq/L (ref 96–112)
Creatinine, Ser: 0.94 mg/dL (ref 0.40–1.20)
GFR: 69.97 mL/min (ref >60.00)
Glucose, Bld: 99 mg/dL (ref 70–99)
Potassium: 4.1 meq/L (ref 3.5–5.1)
Sodium: 139 meq/L (ref 135–145)
Total Bilirubin: 0.6 mg/dL (ref 0.2–1.2)
Total Protein: 6.9 g/dL (ref 6.0–8.3)

## 2024-03-06 LAB — CBC WITH DIFFERENTIAL/PLATELET
Basophils Absolute: 0 K/uL (ref 0.0–0.1)
Basophils Relative: 0.4 % (ref 0.0–3.0)
Eosinophils Absolute: 0.3 K/uL (ref 0.0–0.7)
Eosinophils Relative: 4 % (ref 0.0–5.0)
HCT: 39 % (ref 36.0–46.0)
Hemoglobin: 13 g/dL (ref 12.0–15.0)
Lymphocytes Relative: 24.4 % (ref 12.0–46.0)
Lymphs Abs: 1.6 K/uL (ref 0.7–4.0)
MCHC: 33.2 g/dL (ref 30.0–36.0)
MCV: 92.8 fl (ref 78.0–100.0)
Monocytes Absolute: 0.3 K/uL (ref 0.1–1.0)
Monocytes Relative: 5.2 % (ref 3.0–12.0)
Neutro Abs: 4.3 K/uL (ref 1.4–7.7)
Neutrophils Relative %: 66 % (ref 43.0–77.0)
Platelets: 231 K/uL (ref 150.0–400.0)
RBC: 4.2 Mil/uL (ref 3.87–5.11)
RDW: 13.3 % (ref 11.5–15.5)
WBC: 6.5 K/uL (ref 4.0–10.5)

## 2024-03-06 LAB — TSH: TSH: 0.21 u[IU]/mL — ABNORMAL LOW (ref 0.35–5.50)

## 2024-03-06 LAB — LIPID PANEL
Cholesterol: 160 mg/dL (ref 0–200)
HDL: 42 mg/dL (ref >39.00)
LDL Cholesterol: 90 mg/dL (ref 0–99)
NonHDL: 117.62
Total CHOL/HDL Ratio: 4
Triglycerides: 140 mg/dL (ref 0.0–149.0)
VLDL: 28 mg/dL (ref 0.0–40.0)

## 2024-03-06 LAB — VITAMIN D 25 HYDROXY (VIT D DEFICIENCY, FRACTURES): VITD: 63.34 ng/mL (ref 30.00–100.00)

## 2024-03-06 MED ORDER — ALPRAZOLAM 0.5 MG PO TABS
0.5000 mg | ORAL_TABLET | Freq: Every day | ORAL | 0 refills | Status: AC | PRN
  Filled 2024-03-06: qty 30, 30d supply, fill #0

## 2024-03-06 MED ORDER — ROSUVASTATIN CALCIUM 20 MG PO TABS
20.0000 mg | ORAL_TABLET | Freq: Every day | ORAL | 3 refills | Status: AC
  Filled 2024-03-06 – 2024-08-29 (×3): qty 90, 90d supply, fill #0

## 2024-03-06 NOTE — Assessment & Plan Note (Signed)
Chronic, stable.  She states that the duloxetine 60 mg daily is helping with her anxiety.  She also takes Xanax 0.5 mg as needed.  She states that her last bottle has lasted her over a year.  Refill of Xanax sent to the pharmacy.  PDMP reviewed. 

## 2024-03-06 NOTE — Assessment & Plan Note (Signed)
 Chronic, stable. Continue rosuvastatin  20mg  daily. Check CMP, CBC, lipid panel today and adjust regimen based on results. Refill sent to the pharmacy.

## 2024-03-06 NOTE — Progress Notes (Signed)
 BP 118/82 (BP Location: Left Arm, Patient Position: Sitting, Cuff Size: Normal)   Pulse (!) 59   Temp (!) 97.5 F (36.4 C)   Ht 5' 5 (1.651 m)   Wt 200 lb (90.7 kg)   SpO2 98%   BMI 33.28 kg/m    Subjective:    Patient ID: Kelsey Coleman, female    DOB: [birth year], 53 y.o.   MRN: [id number]  CC: Chief Complaint  Patient presents with   Annual Exam    With fasting labs, Rx refills, no concerns, update Tdap    HPI: Kelsey Coleman is a 53 y.o. female presenting on 03/06/2024 for comprehensive medical examination. Current medical complaints include:sweating  She notes that over the past 2 years, she has noticed increased sweating in her groin, under breasts, and axilla. This occurs with minimal activity like moving folders on her desk. She has been using powders and deodorants. She discussed this with her GYN but was told it's not likely hormonal.   She currently lives with: husband Menopausal Symptoms: no  Depression and Anxiety Screen done today and results listed below:     03/06/2024    9:21 AM 03/09/2023    9:12 AM 03/05/2023    8:29 AM 07/12/2022   10:42 AM 06/14/2022   10:07 AM  Depression screen PHQ 2/9  Decreased Interest 0 0 0 0 0  Down, Depressed, Hopeless 0 0 0 0 0  PHQ - 2 Score 0 0 0 0 0  Altered sleeping 1  0    Tired, decreased energy 0  0    Change in appetite 0  0    Feeling bad or failure about yourself  0  0    Trouble concentrating 0  0    Moving slowly or fidgety/restless 0  0    Suicidal thoughts 0  0    PHQ-9 Score 1  0    Difficult doing work/chores Not difficult at all          03/06/2024    9:21 AM 03/05/2023    8:29 AM 02/24/2022    8:57 AM 02/23/2021   10:46 AM  GAD 7 : Generalized Anxiety Score  Nervous, Anxious, on Edge 1 1 0 0  Control/stop worrying 0 0 0 0  Worry too much - different things 0 0 0 0  Trouble relaxing 1 1 1  0  Restless 0 0 0 0  Easily annoyed or irritable 0 0 0 0  Afraid - awful might happen 0 0 0 0  Total GAD 7 Score  2 2 1  0  Anxiety Difficulty Not difficult at all Not difficult at all      The patient does not have a history of falls. I did not complete a risk assessment for falls. A plan of care for falls was not documented.   Past Medical History:  Past Medical History:  Diagnosis Date   Allergy    dust, milk, pollen, tree nut-diarrhea,    Anxiety    Asthma    Heart murmur    IBS (irritable bowel syndrome)    Thyroid  disease     Surgical History:  Past Surgical History:  Procedure Laterality Date   ABLATION  05/16/2019   right ankle tendon repair     WISDOM TOOTH EXTRACTION      Medications:  Current Outpatient Medications on File Prior to Visit  Medication Sig   albuterol  (VENTOLIN  HFA) 108 (90 Base) MCG/ACT inhaler INHALE TWO PUFFS  BY MOUTH INTO THE LUNGS EVERY 4-6 HOURS IF NEEDED FOR COUGH OR WHEEZE.   Calcium  500-100 MG-UNIT CHEW Calcium  500   cholecalciferol (VITAMIN D ) 25 MCG (1000 UNIT) tablet Take 7 tablets (7,000 Units total) by mouth daily.   cyclobenzaprine  (FLEXERIL ) 5 MG tablet Take 1 tablet (5 mg total) by mouth 3 (three) times daily as needed for muscle spasms.   dicyclomine  (BENTYL ) 10 MG capsule Take 1 capsule (10 mg total) by mouth 4 (four) times daily -  before meals and at bedtime.   DULoxetine  (CYMBALTA ) 60 MG capsule Take 1 capsule (60 mg total) by mouth at bedtime.   levothyroxine  (SYNTHROID ) 75 MCG tablet Take 1 tablet (75 mcg total) by mouth daily.   loratadine (CLARITIN) 10 MG tablet Take 10 mg by mouth daily.   Magnesium 300 MG CAPS Take by mouth.   naltrexone  (DEPADE) 50 MG tablet Take 0.5 tablets (25 mg total) by mouth daily. Pt actually taking low dose naltrexone  6 mg nightly- for chronic pain   [DISCONTINUED] levETIRAcetam  (KEPPRA ) 500 MG tablet Take 1 tablet (500 mg total) by mouth 2 (two) times daily. X 1 week, then 1000 mg 2x/day for low back pain   Current Facility-Administered Medications on File Prior to Visit  Medication   lidocaine  (XYLOCAINE )  1 % (with pres) injection 3 mL    Allergies:  Allergies  Allergen Reactions   Bee Pollen Itching and Swelling   Casein Diarrhea    Other reaction(s): Other   Chamomile Diarrhea   Honey Diarrhea   Honey Bee Venom Itching and Swelling   Iodinated Contrast Media Hives    Other reaction(s): Other   Milk-Related Compounds Other (See Comments)   Pollen Extract Cough, Diarrhea and Itching   Fruit Extracts Diarrhea and Itching    Social History:  Social History   Socioeconomic History   Marital status: Married    Spouse name: Not on file   Number of children: Not on file   Years of education: Not on file   Highest education level: Associate degree: occupational, Scientist, product/process development, or vocational program  Occupational History   Not on file  Tobacco Use   Smoking status: Never   Smokeless tobacco: Never  Vaping Use   Vaping status: Never Used  Substance and Sexual Activity   Alcohol use: Yes    Comment: less than weekly, occasional   Drug use: Never   Sexual activity: Yes    Comment: Vasectomy  Other Topics Concern   Not on file  Social History Narrative   Not on file   Social Drivers of Health   Financial Resource Strain: Low Risk  (03/06/2024)   Overall Financial Resource Strain (CARDIA)    Difficulty of Paying Living Expenses: Not hard at all  Food Insecurity: No Food Insecurity (03/06/2024)   Hunger Vital Sign    Worried About Running Out of Food in the Last Year: Never true    Ran Out of Food in the Last Year: Never true  Transportation Needs: No Transportation Needs (03/06/2024)   PRAPARE - Administrator, Civil Service (Medical): No    Lack of Transportation (Non-Medical): No  Physical Activity: Insufficiently Active (03/06/2024)   Exercise Vital Sign    Days of Exercise per Week: 1 day    Minutes of Exercise per Session: 40 min  Stress: No Stress Concern Present (03/06/2024)   [employer] of Occupational Health - Occupational Stress Questionnaire     Feeling of Stress: Only a little  Social Connections: Moderately Isolated (03/06/2024)   Social Connection and Isolation Panel    Frequency of Communication with Friends and Family: Once a week    Frequency of Social Gatherings with Friends and Family: Once a week    Attends Religious Services: More than 4 times per year    Active Member of [employer] or Organizations: No    Attends Engineer, structural: Not on file    Marital Status: Married  Catering manager Violence: Not on file   Social History   Tobacco Use  Smoking Status Never  Smokeless Tobacco Never   Social History   Substance and Sexual Activity  Alcohol Use Yes   Comment: less than weekly, occasional    Family History:  Family History  Problem Relation Age of Onset   Stroke Mother    Cancer Mother    Hyperlipidemia Mother    Anxiety disorder Mother    Cancer Father    Arthritis Father    Hearing loss Father    Alcohol abuse Sister    Mental illness Sister    Hearing loss Maternal Grandfather    Hypertension Paternal Grandmother    Kidney disease Paternal Grandmother    Anxiety disorder Sister    Colon polyps Neg Hx    Colon cancer Neg Hx    Esophageal cancer Neg Hx    Rectal cancer Neg Hx    Stomach cancer Neg Hx     Past medical history, surgical history, medications, allergies, family history and social history reviewed with patient today and changes made to appropriate areas of the chart.   Review of Systems  Constitutional:  Positive for malaise/fatigue (at times). Negative for fever.  HENT: Negative.    Eyes: Negative.   Respiratory: Negative.    Cardiovascular: Negative.   Gastrointestinal: Negative.   Genitourinary: Negative.   Musculoskeletal: Negative.   Skin:  Negative for itching and rash.       Frequent sweating  Neurological: Negative.   Psychiatric/Behavioral: Negative.     All other ROS negative except what is listed above and in the HPI.      Objective:    BP 118/82  (BP Location: Left Arm, Patient Position: Sitting, Cuff Size: Normal)   Pulse (!) 59   Temp (!) 97.5 F (36.4 C)   Ht 5' 5 (1.651 m)   Wt 200 lb (90.7 kg)   SpO2 98%   BMI 33.28 kg/m   Wt Readings from Last 3 Encounters:  03/06/24 200 lb (90.7 kg)  06/05/23 195 lb 12.8 oz (88.8 kg)  03/09/23 194 lb (88 kg)    Physical Exam Vitals and nursing note reviewed.  Constitutional:      General: She is not in acute distress.    Appearance: Normal appearance.  HENT:     Head: Normocephalic and atraumatic.     Right Ear: Tympanic membrane, ear canal and external ear normal.     Left Ear: Tympanic membrane, ear canal and external ear normal.     Mouth/Throat:     Mouth: Mucous membranes are moist.     Pharynx: No posterior oropharyngeal erythema.  Eyes:     Conjunctiva/sclera: Conjunctivae normal.  Cardiovascular:     Rate and Rhythm: Normal rate and regular rhythm.     Pulses: Normal pulses.     Heart sounds: Normal heart sounds.  Pulmonary:     Effort: Pulmonary effort is normal.     Breath sounds: Normal breath sounds.  Abdominal:  Palpations: Abdomen is soft.     Tenderness: There is no abdominal tenderness.  Musculoskeletal:        General: Normal range of motion.     Cervical back: Normal range of motion and neck supple.     Right lower leg: No edema.     Left lower leg: No edema.  Lymphadenopathy:     Cervical: No cervical adenopathy.  Skin:    General: Skin is warm and dry.  Neurological:     General: No focal deficit present.     Mental Status: She is alert and oriented to person, place, and time.     Cranial Nerves: No cranial nerve deficit.     Coordination: Coordination normal.     Gait: Gait normal.  Psychiatric:        Mood and Affect: Mood normal.        Behavior: Behavior normal.        Thought Content: Thought content normal.        Judgment: Judgment normal.     Results for orders placed or performed during the hospital encounter of 09/28/23   [facility] Molecular Screen- Blood ([facility])   Collection Time: 09/28/23  8:35 AM  Result Value Ref Range   Genetic Analysis Overall Interpretation Negative    Genetic Disease Assessed      [facility] Population Screen is a screening test that analyzes 11 genes related to hereditary breast and ovarian cancer (HBOC) syndrome, Lynch syndrome, and familial hypercholesterolemia. This test only reports clinically significant pathogenic and  likely pathogenic variants but does not report variants of uncertain significance (VUS). In addition, analysis of the PMS2 gene excludes exons 11-15, which overlap with a known pseudogene (PMS2CL).    Genetic Analysis Report      No pathogenic or likely pathogenic variants were detected in the genes analyzed by this test.Genetic test results should be interpreted in the context of an individual's personal medical and family history. Alteration to medical management is NOT  recommended based solely on this result. Clinical correlation is advised.Additional Considerations- This is a screening test; individuals may still carry pathogenic or likely pathogenic variant(s) in the tested genes that are not detected by this test.-  For individuals at risk for these or other related conditions based on factors including personal or family history, diagnostic testing is recommended.- The absence of pathogenic or likely pathogenic variant(s) in the analyzed genes, while reassuring,  does not eliminate the possibility of a hereditary condition; there are other variants and genes associated with heart disease and hereditary cancer that are not included in this test.    Genes Tested See Notes    Disclaimer See Notes    Sequencing Location See Notes    Interpretation Methods and Limitations See Notes       Assessment & Plan:   Problem List Items Addressed This Visit       Respiratory   Mild intermittent asthma   Chronic, stable.  She states that her  allergies can sometimes induce asthma.  She has an albuterol  inhaler that she uses as needed.  Follow-up with any concerns or worsening symptoms.        Endocrine   Hypothyroidism   Chronic, stable.  We will check TSH today.  Continue levothyroxine  75mcg daily.       Relevant Orders   TSH     Musculoskeletal and Integument   Hyperhidrosis   She has been experiencing hyperhidrosis for the past  2 years. Does not sound like related to menopause. Discussed wearing layers and trying different antiperspirants. Follow-up if OTC treatments aren't working.         Other   Avitaminosis D   Check vitamin D  levels and adjust regimen based on results.       Relevant Orders   VITAMIN D  25 Hydroxy (Vit-D Deficiency, Fractures)   Anxiety   Chronic, stable.  She states that the duloxetine  60 mg daily is helping with her anxiety.  She also takes Xanax  0.5 mg as needed.  She states that her last bottle has lasted her over a year.  Refill of Xanax  sent to the pharmacy.  PDMP reviewed.      Relevant Medications   ALPRAZolam  (XANAX ) 0.5 MG tablet   Generalized pain   Chronic, stable. She is currently following with Dr. Lovorn. Continue duloxetine  60mg  daily and naltrexone  25mg  daily as prescribed by specialist.       Mixed hyperlipidemia   Chronic, stable. Continue rosuvastatin  20mg  daily. Check CMP, CBC, lipid panel today and adjust regimen based on results. Refill sent to the pharmacy.       Relevant Medications   rosuvastatin  (CRESTOR ) 20 MG tablet   Other Relevant Orders   CBC with Differential/Platelet   Comprehensive metabolic panel with GFR   Lipid panel   Routine general medical examination at a health care facility - Primary   Health maintenance reviewed and updated. Discussed nutrition, exercise. Follow-up 1 year.        Immunization due   Tdap given today      Relevant Orders   Tdap vaccine greater than or equal to 7yo IM     Follow up plan: Return in about 1 year  (around 03/06/2025) for CPE.   LABORATORY TESTING:  - Pap smear: up to date  IMMUNIZATIONS:   - Tdap: Tetanus vaccination status reviewed: Td vaccination indicated and given today. - Influenza: Postponed to flu season - Pneumovax: Not applicable - Prevnar: Up to date - HPV: Not applicable - Shingrix vaccine: Declined  SCREENING: -Mammogram: Up to date  - Colonoscopy: Up to date  - Bone Density: Not applicable   PATIENT COUNSELING:   Advised to take 1 mg of folate supplement per day if capable of pregnancy.   Sexuality: Discussed sexually transmitted diseases, partner selection, use of condoms, avoidance of unintended pregnancy  and contraceptive alternatives.   Advised to avoid cigarette smoking.  I discussed with the patient that most people either abstain from alcohol or drink within safe limits (<=14/week and <=4 drinks/occasion for males, <=7/weeks and <= 3 drinks/occasion for females) and that the risk for alcohol disorders and other health effects rises proportionally with the number of drinks per week and how often a drinker exceeds daily limits.  Discussed cessation/primary prevention of drug use and availability of treatment for abuse.   Diet: Encouraged to adjust caloric intake to maintain  or achieve ideal body weight, to reduce intake of dietary saturated fat and total fat, to limit sodium intake by avoiding high sodium foods and not adding table salt, and to maintain adequate dietary potassium and calcium  preferably from fresh fruits, vegetables, and low-fat dairy products.    stressed the importance of regular exercise  Injury prevention: Discussed safety belts, safety helmets, smoke detector, smoking near bedding or upholstery.   Dental health: Discussed importance of regular tooth brushing, flossing, and dental visits.    NEXT PREVENTATIVE PHYSICAL DUE IN 1 YEAR. Return in about 1 year (  around 03/06/2025) for CPE.  Caroleann Casler A Cecely Rengel

## 2024-03-06 NOTE — Assessment & Plan Note (Signed)
 Tdap given today.

## 2024-03-06 NOTE — Assessment & Plan Note (Signed)
 Chronic, stable. She is currently following with Dr. Lovorn. Continue duloxetine  60mg  daily and naltrexone  25mg  daily as prescribed by specialist.

## 2024-03-06 NOTE — Assessment & Plan Note (Signed)
Chronic, stable.  She states that her allergies can sometimes induce asthma.  She has an albuterol inhaler that she uses as needed.  Follow-up with any concerns or worsening symptoms.

## 2024-03-06 NOTE — Assessment & Plan Note (Signed)
Check vitamin D levels and adjust regimen based on results.

## 2024-03-06 NOTE — Patient Instructions (Signed)
 It was great to see you!  We are checking your labs today and will let you know the results via mychart/phone.   Try different antiperspirants in the areas that you sweat to see if one works better than the other. Wear layers  Let's follow-up in 1 year, sooner if you have concerns.  If a referral was placed today, you will be contacted for an appointment. Please note that routine referrals can sometimes take up to 3-4 weeks to process. Please call our office if you haven't heard anything after this time frame.  Take care,  Tinnie Harada, NP

## 2024-03-06 NOTE — Assessment & Plan Note (Signed)
 She has been experiencing hyperhidrosis for the past 2 years. Does not sound like related to menopause. Discussed wearing layers and trying different antiperspirants. Follow-up if OTC treatments aren't working.

## 2024-03-06 NOTE — Assessment & Plan Note (Signed)
 Chronic, stable.  We will check TSH today.  Continue levothyroxine  75mcg daily.

## 2024-03-06 NOTE — Assessment & Plan Note (Signed)
Health maintenance reviewed and updated. Discussed nutrition, exercise. Follow-up 1 year.

## 2024-03-07 ENCOUNTER — Ambulatory Visit: Payer: Self-pay | Admitting: Nurse Practitioner

## 2024-03-07 ENCOUNTER — Ambulatory Visit: Payer: Commercial Managed Care - PPO | Admitting: Physical Medicine and Rehabilitation

## 2024-03-07 DIAGNOSIS — E039 Hypothyroidism, unspecified: Secondary | ICD-10-CM

## 2024-03-23 ENCOUNTER — Other Ambulatory Visit: Payer: Self-pay | Admitting: Nurse Practitioner

## 2024-03-23 DIAGNOSIS — E039 Hypothyroidism, unspecified: Secondary | ICD-10-CM

## 2024-03-25 ENCOUNTER — Other Ambulatory Visit (HOSPITAL_COMMUNITY): Payer: Self-pay

## 2024-03-25 MED ORDER — LEVOTHYROXINE SODIUM 75 MCG PO TABS
75.0000 ug | ORAL_TABLET | Freq: Every day | ORAL | 3 refills | Status: DC
Start: 2024-03-25 — End: 2024-06-12
  Filled 2024-03-25: qty 90, 90d supply, fill #0

## 2024-03-25 NOTE — Telephone Encounter (Signed)
 Requesting: levothyroxine  (SYNTHROID ) 75 MCG tablet  Last Visit: 03/06/2024 Next Visit: Visit date not found Last Refill: 03/05/2023  Please Advise

## 2024-04-11 ENCOUNTER — Encounter: Payer: Self-pay | Admitting: Physical Medicine and Rehabilitation

## 2024-04-11 ENCOUNTER — Encounter: Attending: Physical Medicine and Rehabilitation | Admitting: Physical Medicine and Rehabilitation

## 2024-04-11 ENCOUNTER — Other Ambulatory Visit (HOSPITAL_COMMUNITY): Payer: Self-pay

## 2024-04-11 VITALS — BP 112/77 | HR 69 | Ht 65.0 in | Wt 204.6 lb

## 2024-04-11 DIAGNOSIS — M545 Low back pain, unspecified: Secondary | ICD-10-CM | POA: Insufficient documentation

## 2024-04-11 DIAGNOSIS — M7918 Myalgia, other site: Secondary | ICD-10-CM | POA: Insufficient documentation

## 2024-04-11 DIAGNOSIS — R52 Pain, unspecified: Secondary | ICD-10-CM | POA: Diagnosis not present

## 2024-04-11 DIAGNOSIS — G8929 Other chronic pain: Secondary | ICD-10-CM | POA: Insufficient documentation

## 2024-04-11 MED ORDER — CYCLOBENZAPRINE HCL 5 MG PO TABS
5.0000 mg | ORAL_TABLET | Freq: Three times a day (TID) | ORAL | 3 refills | Status: AC | PRN
  Filled 2024-04-11: qty 90, 30d supply, fill #0

## 2024-04-11 MED ORDER — DULOXETINE HCL 60 MG PO CPEP
60.0000 mg | ORAL_CAPSULE | Freq: Every day | ORAL | 3 refills | Status: AC
  Filled 2024-04-11: qty 90, 90d supply, fill #0
  Filled 2024-07-10: qty 90, 90d supply, fill #1

## 2024-04-11 NOTE — Progress Notes (Signed)
 Subjective:    Patient ID: Kelsey Coleman, female    DOB: [birth year], 53 y.o.   MRN: [id number]  HPI Pt is a 53 year old female with hx of Fibroids, s/p ablation, Hypermethianemia, IBS, hypothyroidism, and anxiety and myofascial pain Here for chronic back pain and neck and R bicipital tendinitis pain.      Have downsized- had dumpster and did major move- 3000 ft to 1800 ft house. Got through move ok. Building a gazebo in backyard.  Got overconfident- bending over.   Monday is hit her- lifted  Grandbabies came and picking them up more- hurting SO much.   Jaw has been hurting for 2-3 months- wasn't her teeth- wearing CPAP- and clenching and tight on jaw  Across shoulders- hunching over computer.   R shoulder popping occ- not really any pain.   No good results with Lidoderm  patches.   Going to take Naprosyn and flexeril  to help back pain- doesn't want injections or steroids.   Has child adult I basement, but they are gone since she moved.  Son and grandkids moving form [place] to [place] and expecting a baby.     Pain Inventory Average Pain 2 Pain Right Now 7 My pain is dull, aching, and tight  In the last 24 hours, has pain interfered with the following? General activity 4 Relation with others 0 Enjoyment of life 3 What TIME of day is your pain at its worst? morning  and evening Sleep (in general) Good  Pain is worse with: bending Pain improves with: rest, heat/ice, and medication Relief from Meds: 7  Family History  Problem Relation Age of Onset   Stroke Mother    Cancer Mother    Hyperlipidemia Mother    Anxiety disorder Mother    Cancer Father    Arthritis Father    Hearing loss Father    Alcohol abuse Sister    Mental illness Sister    Hearing loss Maternal Grandfather    Hypertension Paternal Grandmother    Kidney disease Paternal Grandmother    Anxiety disorder Sister    Colon polyps Neg Hx    Colon cancer Neg Hx    Esophageal cancer Neg Hx    Rectal  cancer Neg Hx    Stomach cancer Neg Hx    Social History   Socioeconomic History   Marital status: Married    Spouse name: Not on file   Number of children: Not on file   Years of education: Not on file   Highest education level: Associate degree: occupational, Scientist, product/process development, or vocational program  Occupational History   Not on file  Tobacco Use   Smoking status: Never   Smokeless tobacco: Never  Vaping Use   Vaping status: Never Used  Substance and Sexual Activity   Alcohol use: Yes    Comment: less than weekly, occasional   Drug use: Never   Sexual activity: Yes    Comment: Vasectomy  Other Topics Concern   Not on file  Social History Narrative   Not on file   Social Drivers of Health   Financial Resource Strain: Low Risk  (03/06/2024)   Overall Financial Resource Strain (CARDIA)    Difficulty of Paying Living Expenses: Not hard at all  Food Insecurity: No Food Insecurity (03/06/2024)   Hunger Vital Sign    Worried About Running Out of Food in the Last Year: Never true    Ran Out of Food in the Last Year: Never true  Transportation  Needs: No Transportation Needs (03/06/2024)   PRAPARE - Administrator, Civil Service (Medical): No    Lack of Transportation (Non-Medical): No  Physical Activity: Insufficiently Active (03/06/2024)   Exercise Vital Sign    Days of Exercise per Week: 1 day    Minutes of Exercise per Session: 40 min  Stress: No Stress Concern Present (03/06/2024)   [employer] of Occupational Health - Occupational Stress Questionnaire    Feeling of Stress: Only a little  Social Connections: Moderately Isolated (03/06/2024)   Social Connection and Isolation Panel    Frequency of Communication with Friends and Family: Once a week    Frequency of Social Gatherings with Friends and Family: Once a week    Attends Religious Services: More than 4 times per year    Active Member of Clubs or Organizations: No    Attends Engineer, structural:  Not on file    Marital Status: Married   Past Surgical History:  Procedure Laterality Date   ABLATION  05/16/2019   right ankle tendon repair     WISDOM TOOTH EXTRACTION     Past Surgical History:  Procedure Laterality Date   ABLATION  05/16/2019   right ankle tendon repair     WISDOM TOOTH EXTRACTION     Past Medical History:  Diagnosis Date   Allergy    dust, milk, pollen, tree nut-diarrhea,    Anxiety    Asthma    Heart murmur    IBS (irritable bowel syndrome)    Thyroid  disease    BP 112/77 (BP Location: Left Arm, Patient Position: Sitting, Cuff Size: Large)   Pulse 69   Ht 5' 5 (1.651 m)   Wt 204 lb 9.6 oz (92.8 kg)   SpO2 96%   BMI 34.05 kg/m   Opioid Risk Score:   Fall Risk Score:  `1  Depression screen [facility] 2/9     03/06/2024    9:21 AM 03/09/2023    9:12 AM 03/05/2023    8:29 AM 07/12/2022   10:42 AM 06/14/2022   10:07 AM 04/21/2022    9:28 AM 02/24/2022    8:57 AM  Depression screen PHQ 2/9  Decreased Interest 0 0 0 0 0 0 0  Down, Depressed, Hopeless 0 0 0 0 0 0 0  PHQ - 2 Score 0 0 0 0 0 0 0  Altered sleeping 1  0    0  Tired, decreased energy 0  0    0  Change in appetite 0  0    0  Feeling bad or failure about yourself  0  0    0  Trouble concentrating 0  0    0  Moving slowly or fidgety/restless 0  0    0  Suicidal thoughts 0  0    0  PHQ-9 Score 1  0    0  Difficult doing work/chores Not difficult at all            Review of Systems  Musculoskeletal:  Positive for back pain.       Neck and jaw pain, right shoulder pain radiating across upper back, mid and lower back pain and right buttock pain  All other systems reviewed and are negative.      Objective:   Physical Exam   Awake, alert, appropriate, smiling, NAD C/o back pain TrP in masseters in jaw R>L         Assessment & Plan:  Pt is a 53 year old female with hx of Fibroids, s/p ablation, Hypermethianemia, IBS, hypothyroidism, and anxiety and myofascial pain Here for  chronic back pain and neck and R bicipital tendinitis pain.      Cannot do trp injections- because don't have insurance approval .  2.  We discussed steroids to calm it down, but she wants ot focus on rest.  Using Naproxen-    3. Refill Duloxetine  60 mg at bedtime for nerve pain- sent in refills for 1 year.   4. Sent in refills of Flexeril  as needed 5 g 3x/day PRN- #90- has refills given   5. Went over how to do myofascial release on jaw and around gums- roll the gums and down- as well as hold masseter, cheek muscle inside and outside to hold pressure 2+ minutes- and then if that's not enough- hold pressure back with finger on back of jaw inside-   6.  Con't Naltrexone  6 mg at bedtime for nerve/chronic pain- called into [facility] for 1 year- 3 months supply with 3 refills.   7. F/U on chronic pain-  1year.   8. TSH usually running ~ 0.5 but last time in July 0.21- rechecking it   I spent a total of 21   minutes on total care today- >50% coordination of care- due to d/w pt about refills, and current pain issues

## 2024-04-11 NOTE — Patient Instructions (Signed)
 Pt is a 53 year old female with hx of Fibroids, s/p ablation, Hypermethianemia, IBS, hypothyroidism, and anxiety and myofascial pain Here for chronic back pain and neck and R bicipital tendinitis pain.      Cannot do trp injections- because don't have insurance approval .  2.  We discussed steroids to calm it down, but she wants ot focus on rest.  Using Naproxen-    3. Refill Duloxetine  60 mg at bedtime for nerve pain- sent in refills for 1 year.   4. Sent in refills of Flexeril  as needed 5 g 3x/day PRN- #90- has refills given   5. Went over how to do myofascial release on jaw and around gums- roll the gums and down- as well as hold masseter, cheek muscle inside and outside to hold pressure 2+ minutes- and then if that's not enough- hold pressure back with finger on back of jaw inside-   6.  Con't Naltrexone  6 mg at bedtime for nerve/chronic pain- called into [facility] for 1 year- 3 months supply with 3 refills.   7. F/U on chronic pain-  1year.

## 2024-05-04 ENCOUNTER — Encounter: Payer: Self-pay | Admitting: Physical Medicine and Rehabilitation

## 2024-05-19 ENCOUNTER — Other Ambulatory Visit (HOSPITAL_COMMUNITY): Payer: Self-pay

## 2024-05-19 MED ORDER — PREDNISONE 20 MG PO TABS
20.0000 mg | ORAL_TABLET | Freq: Every day | ORAL | 0 refills | Status: AC
  Filled 2024-05-19: qty 5, 5d supply, fill #0

## 2024-05-28 ENCOUNTER — Other Ambulatory Visit (HOSPITAL_COMMUNITY): Payer: Self-pay

## 2024-06-04 ENCOUNTER — Other Ambulatory Visit (HOSPITAL_COMMUNITY): Payer: Self-pay

## 2024-06-12 ENCOUNTER — Other Ambulatory Visit: Payer: Self-pay

## 2024-06-12 ENCOUNTER — Other Ambulatory Visit (HOSPITAL_COMMUNITY): Payer: Self-pay

## 2024-06-12 ENCOUNTER — Ambulatory Visit: Payer: Self-pay | Admitting: Nurse Practitioner

## 2024-06-12 ENCOUNTER — Other Ambulatory Visit

## 2024-06-12 DIAGNOSIS — E039 Hypothyroidism, unspecified: Secondary | ICD-10-CM | POA: Diagnosis not present

## 2024-06-12 LAB — T4, FREE: Free T4: 0.82 ng/dL (ref 0.60–1.60)

## 2024-06-12 LAB — TSH: TSH: 0.28 u[IU]/mL — ABNORMAL LOW (ref 0.35–5.50)

## 2024-06-12 MED ORDER — LEVOTHYROXINE SODIUM 50 MCG PO TABS
50.0000 ug | ORAL_TABLET | Freq: Every day | ORAL | 0 refills | Status: DC
  Filled 2024-06-12: qty 90, 90d supply, fill #0

## 2024-06-25 ENCOUNTER — Encounter: Attending: Physical Medicine and Rehabilitation | Admitting: Physical Medicine and Rehabilitation

## 2024-07-31 ENCOUNTER — Other Ambulatory Visit

## 2024-07-31 ENCOUNTER — Ambulatory Visit: Payer: Self-pay | Admitting: Nurse Practitioner

## 2024-07-31 DIAGNOSIS — E039 Hypothyroidism, unspecified: Secondary | ICD-10-CM

## 2024-07-31 LAB — TSH: TSH: 2.15 u[IU]/mL (ref 0.35–5.50)

## 2024-07-31 LAB — T4, FREE: Free T4: 0.63 ng/dL (ref 0.60–1.60)

## 2024-08-05 ENCOUNTER — Other Ambulatory Visit (HOSPITAL_COMMUNITY): Payer: Self-pay

## 2024-08-29 ENCOUNTER — Other Ambulatory Visit (HOSPITAL_COMMUNITY): Payer: Self-pay

## 2024-08-29 ENCOUNTER — Other Ambulatory Visit: Payer: Self-pay

## 2024-08-29 ENCOUNTER — Other Ambulatory Visit: Payer: Self-pay | Admitting: Nurse Practitioner

## 2024-08-29 MED ORDER — LEVOTHYROXINE SODIUM 50 MCG PO TABS
50.0000 ug | ORAL_TABLET | Freq: Every day | ORAL | 1 refills | Status: AC
  Filled 2024-08-29: qty 90, 90d supply, fill #0

## 2024-08-29 NOTE — Telephone Encounter (Signed)
 Requesting: levothyroxine  (SYNTHROID ) 50 MCG tablet  Last Visit: 03/06/2024 Next Visit: Visit date not found Last Refill: 06/12/2024  Please Advise

## 2024-08-30 ENCOUNTER — Other Ambulatory Visit (HOSPITAL_COMMUNITY): Payer: Self-pay

## 2024-09-01 ENCOUNTER — Other Ambulatory Visit: Payer: Self-pay

## 2024-09-02 ENCOUNTER — Other Ambulatory Visit: Payer: Self-pay

## 2024-09-02 ENCOUNTER — Encounter: Payer: Self-pay | Admitting: Pharmacist

## 2025-04-10 ENCOUNTER — Ambulatory Visit: Admitting: Physical Medicine and Rehabilitation
